# Patient Record
Sex: Male | Born: 1975 | Race: Black or African American | Hispanic: No | Marital: Single | Smoking: Never smoker
Health system: Southern US, Community
[De-identification: ages and names within clinical notes are randomized; demographics above are authoritative.]

## PROBLEM LIST (undated history)

## (undated) DIAGNOSIS — T7840XA Allergy, unspecified, initial encounter: Secondary | ICD-10-CM

## (undated) HISTORY — DX: Allergy, unspecified, initial encounter: T78.40XA

---

## 2008-11-23 ENCOUNTER — Encounter: Admission: RE | Admit: 2008-11-23 | Discharge: 2008-11-23 | Payer: Self-pay | Admitting: Emergency Medicine

## 2011-07-08 ENCOUNTER — Ambulatory Visit (INDEPENDENT_AMBULATORY_CARE_PROVIDER_SITE_OTHER): Payer: Managed Care, Other (non HMO) | Admitting: Family Medicine

## 2011-07-08 VITALS — BP 115/65 | HR 79 | Temp 97.8°F | Resp 18 | Ht 66.25 in | Wt 140.8 lb

## 2011-07-08 DIAGNOSIS — J309 Allergic rhinitis, unspecified: Secondary | ICD-10-CM

## 2011-07-08 DIAGNOSIS — J019 Acute sinusitis, unspecified: Secondary | ICD-10-CM

## 2011-07-08 MED ORDER — FLUTICASONE PROPIONATE 50 MCG/ACT NA SUSP: 2.0000 | Freq: Every day | NASAL | Status: DC

## 2011-07-08 MED ORDER — AMOXICILLIN 875 MG PO TABS: 875.0000 mg | ORAL_TABLET | Freq: Two times a day (BID) | ORAL | Status: AC

## 2011-07-08 NOTE — Progress Notes (Signed)
  Subjective:    Patient ID: Alvin Walker, male    DOB: 1975/11/30, 36 y.o.   MRN: [id number]  HPI 36 yo male here with URI symptoms for 2 weeks. Nasal congestion, can't breathe through nose.  Blows nose and gets lot of yellow mucus but doesn't drain spontaneously.  Can only breathe if uses Afrin - tried not to use too often.  Does have cough, PND, no sore throat.  No fever.    Review of Systems Negative except as per HPI     Objective:   Physical Exam  Constitutional: He appears well-developed. No distress.  HENT:  Right Ear: Tympanic membrane, external ear and ear canal normal. Tympanic membrane is not injected, not scarred, not perforated, not erythematous, not retracted and not bulging.  Left Ear: Tympanic membrane, external ear and ear canal normal. Tympanic membrane is not injected, not scarred, not perforated, not erythematous, not retracted and not bulging.  Nose: Mucosal edema and rhinorrhea present. Right sinus exhibits maxillary sinus tenderness. Right sinus exhibits no frontal sinus tenderness. Left sinus exhibits no maxillary sinus tenderness and no frontal sinus tenderness.  Mouth/Throat: Uvula is midline, oropharynx is clear and moist and mucous membranes are normal. No oropharyngeal exudate or tonsillar abscesses.  Cardiovascular: Normal rate, regular rhythm, normal heart sounds and intact distal pulses.   No murmur heard. Pulmonary/Chest: Effort normal and breath sounds normal. No respiratory distress. He has no wheezes. He has no rales.  Lymphadenopathy:       Head (right side): No submandibular and no preauricular adenopathy present.       Head (left side): No submandibular and no preauricular adenopathy present.       Right cervical: No superficial cervical and no posterior cervical adenopathy present.      Left cervical: No superficial cervical and no posterior cervical adenopathy present.       Right: No supraclavicular adenopathy present.       Left: No  supraclavicular adenopathy present.  Skin: Skin is warm and dry.          Assessment & Plan:  Sinusitis resulting from uncontrolled allergic rhinitis - amoxicillin, zyrtec, flonase.

## 2011-07-14 IMAGING — CT CT HEAD W/O CM
2 series · 16 of 30 positions shown, 20 images · non-contrast
Comparison: None

CLINICAL DATA: Right-sided headache for 2-3 years, no trauma

CT HEAD WITHOUT CONTRAST
TECHNIQUE: Contiguous axial images were obtained from the base of
the skull through the vertex without contrast.

[Series 2: head wo · axial · 0.49mm/px · z∈[-405,-295]mm · 13 of 26 slices shown, 17 images]
[im 2/26  brain]
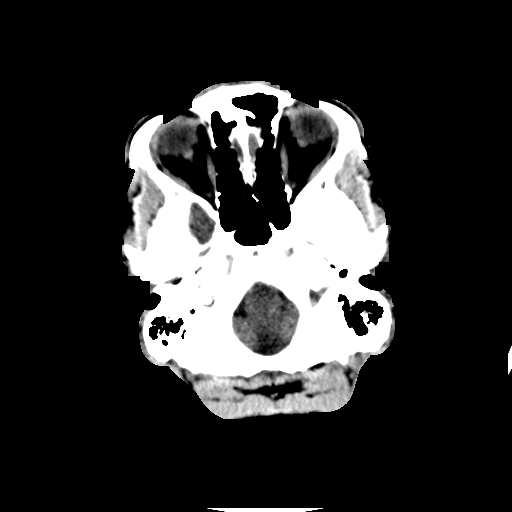
[im 2/26  bone]
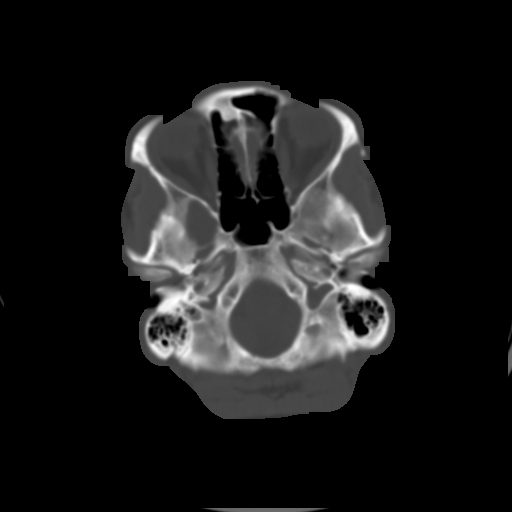
[im 4/26  brain]
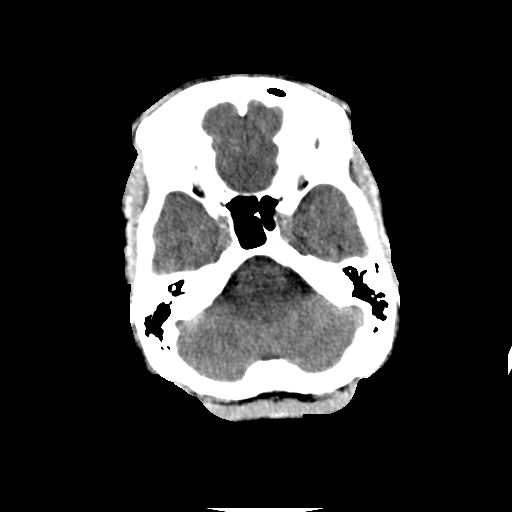
[im 6/26  brain]
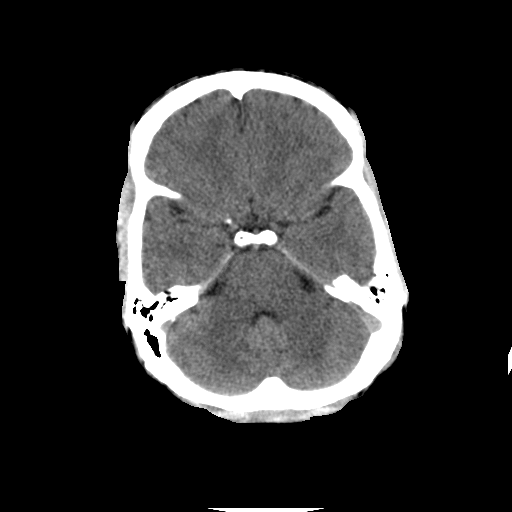
[im 8/26  brain]
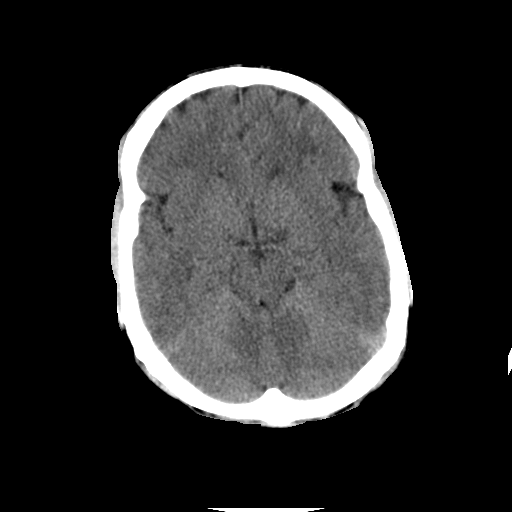
[im 9/26  brain]
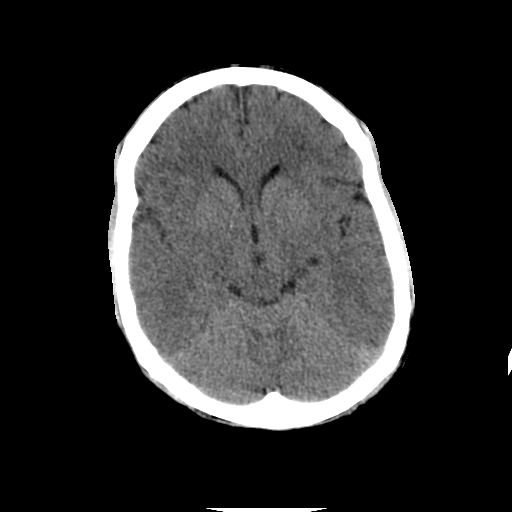
[im 9/26  bone]
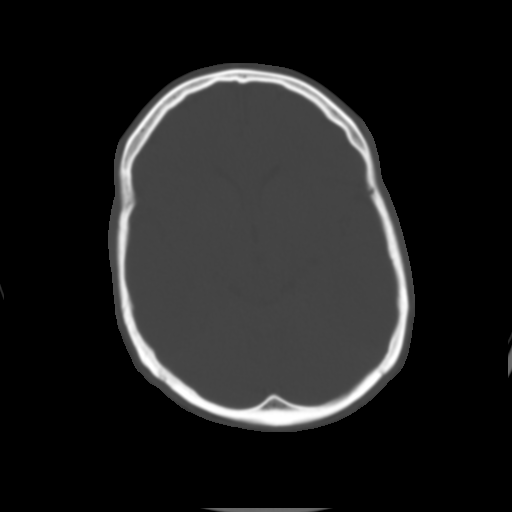
[im 11/26  brain]
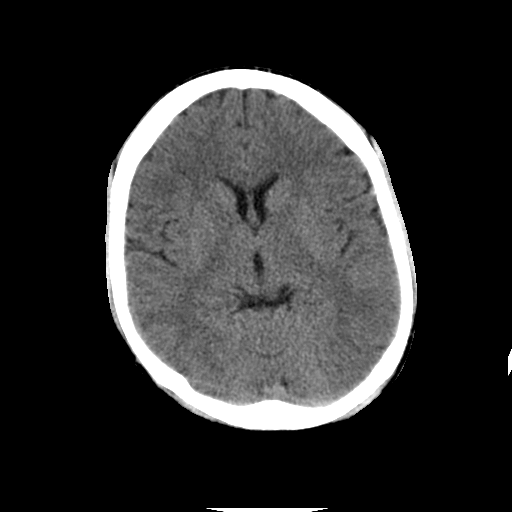
[im 13/26  brain]
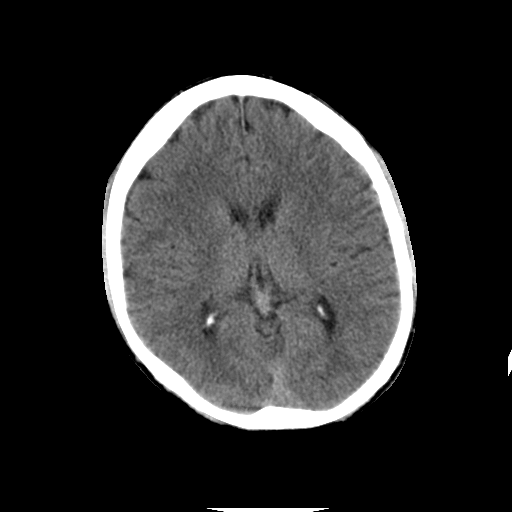
[im 15/26  brain]
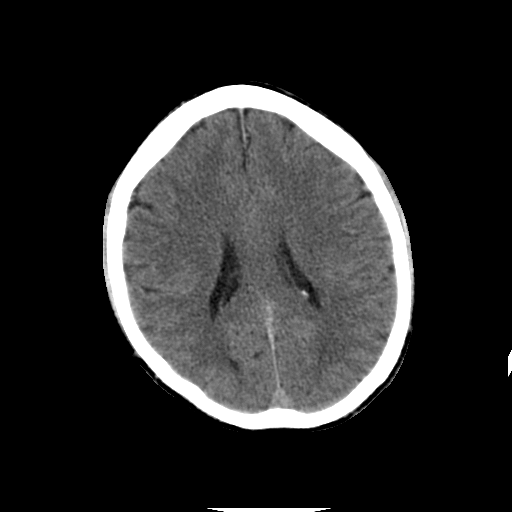
[im 17/26  brain]
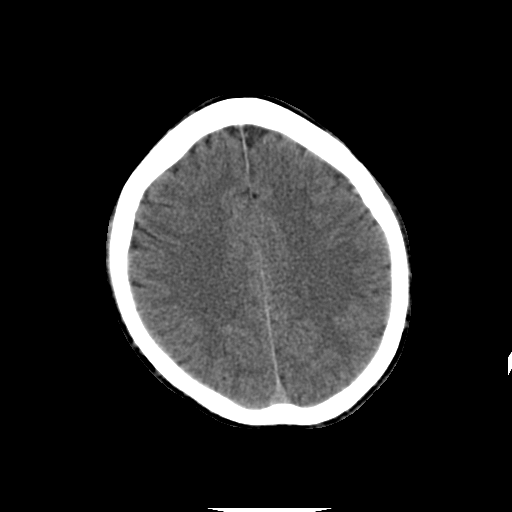
[im 17/26  bone]
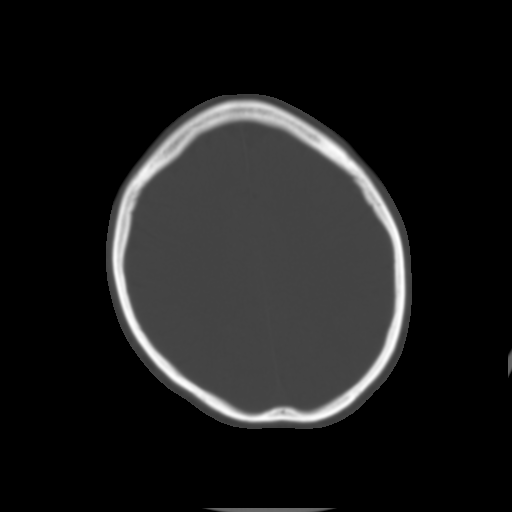
[im 18/26  brain]
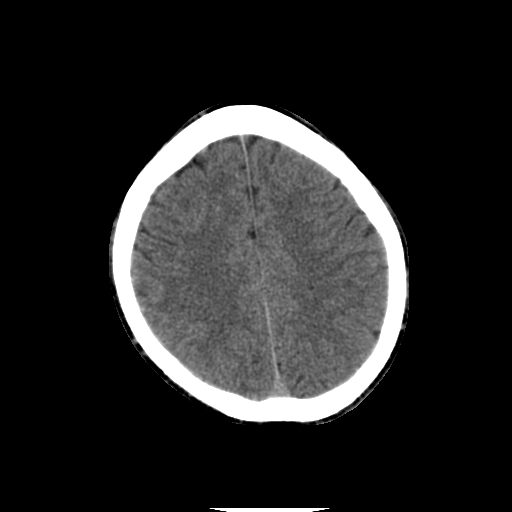
[im 20/26  brain]
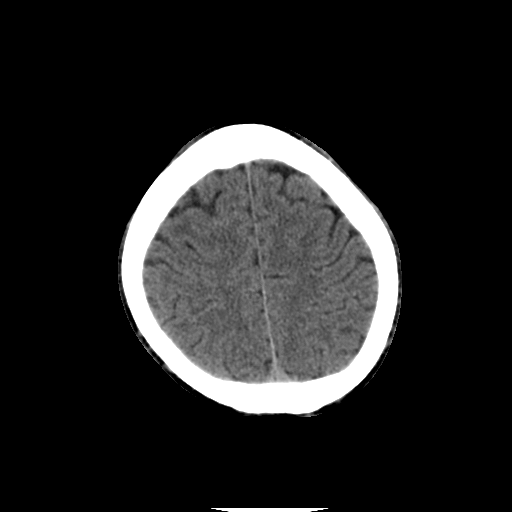
[im 22/26  brain]
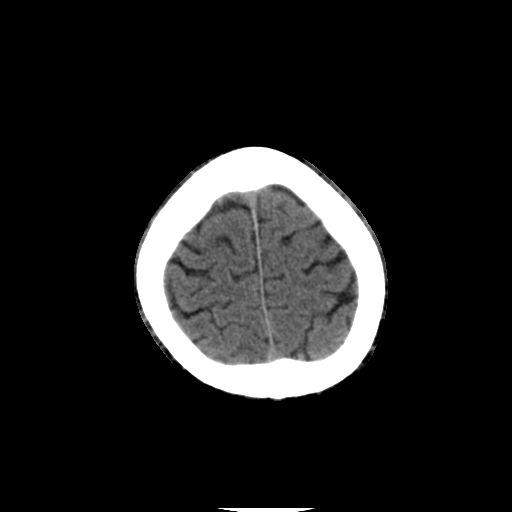
[im 24/26  brain]
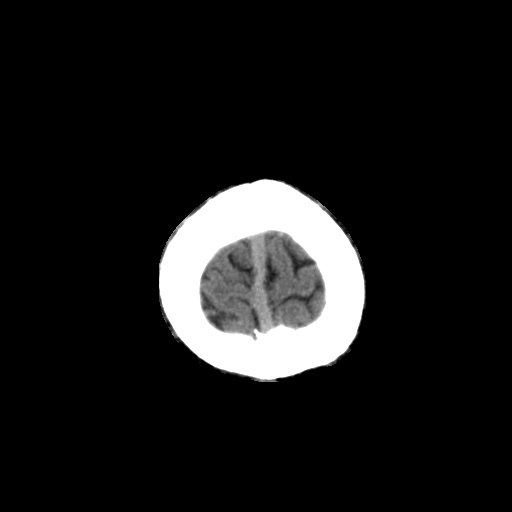
[im 24/26  bone]
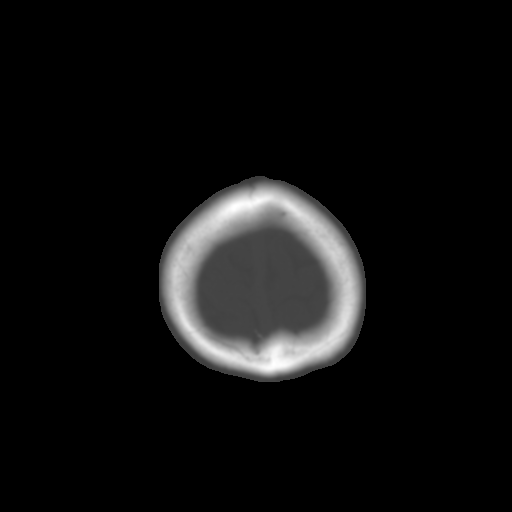

[Series 3: head bone · axial · 0.49mm/px · z∈[-405,-370]mm · 3 of 26 slices shown]
[im 2/26  bone]
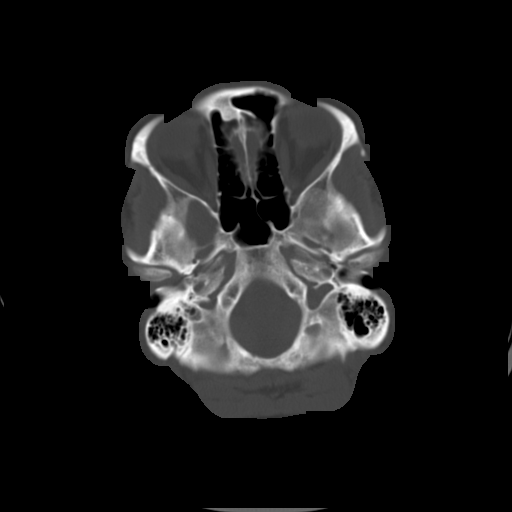
[im 6/26  bone]
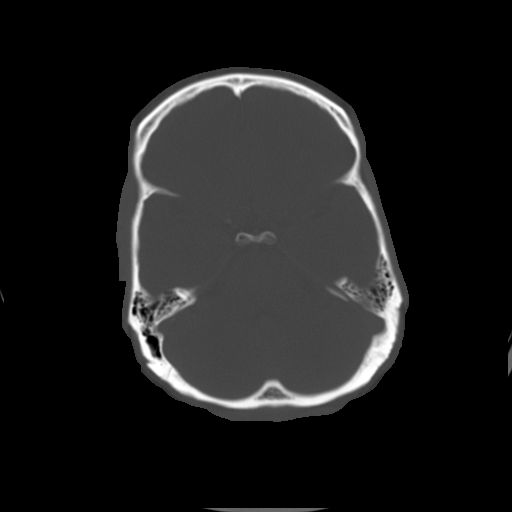
[im 9/26  bone]
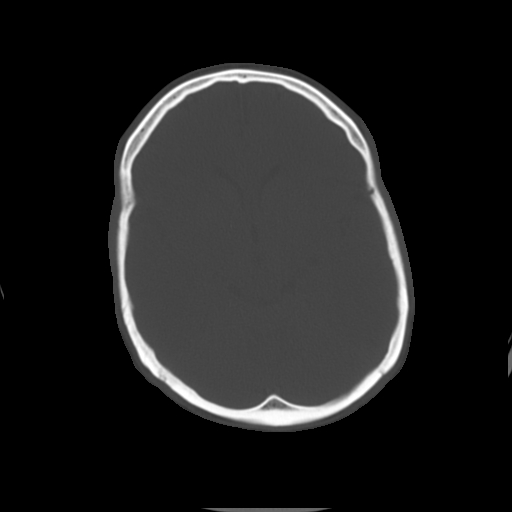

[16 of 30 positions shown; findings below may reference images not displayed]

FINDINGS: The ventricular system is normal in size and
configuration, and the septum is in a normal midline position.  The
fourth ventricle and basilar cisterns appear normal.  No
hemorrhage, mass lesion, or acute infarction is seen.  No bony
abnormality is noted.
IMPRESSION: No acute intracranial abnormality.

## 2012-03-08 ENCOUNTER — Emergency Department (HOSPITAL_COMMUNITY)
Admission: EM | Admit: 2012-03-08 | Discharge: 2012-03-08 | Disposition: A | Discharge status: Home / Self Care | Payer: Managed Care, Other (non HMO) | Attending: Emergency Medicine | Admitting: Emergency Medicine

## 2012-03-08 ENCOUNTER — Emergency Department (HOSPITAL_COMMUNITY): Payer: Managed Care, Other (non HMO)

## 2012-03-08 ENCOUNTER — Encounter (HOSPITAL_COMMUNITY): Payer: Self-pay | Admitting: Emergency Medicine

## 2012-03-08 DIAGNOSIS — R110 Nausea: Secondary | ICD-10-CM | POA: Insufficient documentation

## 2012-03-08 DIAGNOSIS — B9789 Other viral agents as the cause of diseases classified elsewhere: Secondary | ICD-10-CM | POA: Insufficient documentation

## 2012-03-08 DIAGNOSIS — J3489 Other specified disorders of nose and nasal sinuses: Secondary | ICD-10-CM | POA: Insufficient documentation

## 2012-03-08 DIAGNOSIS — R630 Anorexia: Secondary | ICD-10-CM | POA: Insufficient documentation

## 2012-03-08 DIAGNOSIS — R52 Pain, unspecified: Secondary | ICD-10-CM | POA: Insufficient documentation

## 2012-03-08 DIAGNOSIS — B349 Viral infection, unspecified: Secondary | ICD-10-CM

## 2012-03-08 DIAGNOSIS — R6889 Other general symptoms and signs: Secondary | ICD-10-CM | POA: Insufficient documentation

## 2012-03-08 DIAGNOSIS — R51 Headache: Secondary | ICD-10-CM | POA: Insufficient documentation

## 2012-03-08 DIAGNOSIS — R509 Fever, unspecified: Secondary | ICD-10-CM | POA: Insufficient documentation

## 2012-03-08 DIAGNOSIS — J029 Acute pharyngitis, unspecified: Secondary | ICD-10-CM | POA: Insufficient documentation

## 2012-03-08 MED ORDER — HYDROCODONE-ACETAMINOPHEN 7.5-325 MG/15ML PO SOLN: 15.0000 mL | Freq: Four times a day (QID) | ORAL | Status: DC | PRN

## 2012-03-08 MED ORDER — HYDROCOD POLST-CHLORPHEN POLST 10-8 MG/5ML PO LQCR
5.0000 mL | Freq: Once | ORAL | Status: AC
  Administered 2012-03-08: 5 mL via ORAL
  Filled 2012-03-08: qty 5

## 2012-03-08 MED ORDER — GUAIFENESIN 100 MG/5ML PO LIQD: 100.0000 mg | ORAL | Status: DC | PRN

## 2012-03-08 MED ORDER — ACETAMINOPHEN 325 MG PO TABS
650.0000 mg | ORAL_TABLET | Freq: Once | ORAL | Status: AC
  Administered 2012-03-08: 650 mg via ORAL
  Filled 2012-03-08: qty 2

## 2012-03-08 NOTE — ED Provider Notes (Signed)
History     CSN: [id number]  Arrival date & time 03/08/12  1147   First MD Initiated Contact with Patient 03/08/12 1213      Chief Complaint  Patient presents with  . Cough  . Fever  . Generalized Body Aches    3 day hx of URI sx. Sx unresponsive to OTC meds    (Consider location/radiation/quality/duration/timing/severity/associated sxs/prior treatment) HPI  37 year old male presents with flulike symptoms. Patient reports for the past 3 days he has developed acute onset of headache, sneezing, nasal congestion, cough productive with white sputum, nausea, body aches, decrease in appetite. Also endorsed fever, and chills. He reports taking over-the-counter Robitussin, phenol, and Alka-Seltzer with some relief. Today while coughing he notice trace of blood in his sputum which concerns him.  Otherwise patient denies double vision, neck stiffness, vomiting, diarrhea, abdominal pain, or rash. He is a nonsmoker. He is up-to-date with his shots but did not receive a flu shot as it caused him to be sick last year.   History reviewed. No pertinent past medical history.  History reviewed. No pertinent past surgical history.  Family History  Problem Relation Age of Onset  . Hypertension Father     History  Substance Use Topics  . Smoking status: Never Smoker   . Smokeless tobacco: Not on file  . Alcohol Use: Yes      Review of Systems  Constitutional:       A complete 10 system review of systems was obtained and all systems are negative except as noted in the HPI and PMH.    Allergies  Review of patient's allergies indicates no known allergies.  Home Medications   Current Outpatient Rx  Name  Route  Sig  Dispense  Refill  . GUAIFENESIN 100 MG/5ML PO LIQD   Oral   Take 200 mg by mouth 3 (three) times daily as needed.         Marland Kitchen PHENOL 1.4 % MT LIQD   Mouth/Throat   Use as directed 1 spray in the mouth or throat daily as needed. For sore throat         .  PHENYLEPH-CPM-DM-APAP 06-12-08-250 MG PO TBEF   Oral   Take 2 tablets by mouth every 6 (six) hours as needed. For cold           BP 122/75  Pulse 91  Temp 101.3 F (38.5 C) (Oral)  Resp 22  SpO2 98%  Physical Exam  Nursing note and vitals reviewed. Constitutional: He is oriented to person, place, and time. He appears well-developed and well-nourished. No distress.       Awake, alert, nontoxic appearance  HENT:  Head: Atraumatic.       Ear: Mild erythema to the canal and TM bilaterally without significant evidence of infection, nontender on palpation  Nose: Normal  Mouth: Mild post oral pharyngeal erythema without tonsillar enlargement, exudates, or evidence of the tissue infection. Uvula is midline.  Eyes: Conjunctivae normal are normal. Right eye exhibits no discharge. Left eye exhibits no discharge.  Neck: Normal range of motion. Neck supple.       No nuchal rigidity.  Cardiovascular: Normal rate and regular rhythm.   Pulmonary/Chest: Effort normal. No respiratory distress. He exhibits no tenderness.  Abdominal: Soft. There is no tenderness. There is no rebound.  Musculoskeletal: He exhibits no edema and no tenderness.       ROM appears intact, no obvious focal weakness  Neurological: He is alert and oriented to person,  place, and time.  Skin: Skin is warm and dry. No rash noted.  Psychiatric: He has a normal mood and affect.    ED Course  Procedures (including critical care time)   Labs Reviewed  RAPID STREP SCREEN   Results for orders placed during the hospital encounter of 03/08/12  RAPID STREP SCREEN      Component Value Range   Streptococcus, Group A Screen (Direct) NEGATIVE  NEGATIVE   Dg Chest 2 View  03/08/2012  *RADIOLOGY REPORT*  Clinical Data: Cough, fever and congestion.  CHEST - 2 VIEW  Comparison: None.  Findings: The lung volumes are normal and no focal infiltrates are identified.  There is mild suggestion of bronchial and interstitial prominence  which may reflect inflammatory changes such as bronchitis or interstitial pneumonia.  No pulmonary edema, pleural fluid or pulmonary nodules are identified.  Cardiac and mediastinal contours are within normal limits.  Visualized bony structures are unremarkable.  IMPRESSION: No focal pulmonary infiltrate.  Suggestion of mild bronchial and interstitial prominence which may be chronic versus reflective of an inflammatory process.   Original Report Authenticated By: Irish Lack, M.D.    BP 122/75  Pulse 91  Temp 101.3 F (38.5 C) (Oral)  Resp 22  SpO2 98%  I have reviewed nursing notes and vital signs.  I reviewed available ER/hospitalization records thought the EMR   1. Viral syndrome/ Flu like symptom  MDM  Pt with flu-like sxs.  Has temp of 101.3.  Tylenol given.  Rapid strep test and CXR ordered.  Tussionex given in ER.   Pt with presentation with consistent with acute viral illness.  As patient does not present w/ any signs/symptoms of pneumonia or other complications, deferred  further labwork at this time.  CXR reviewed by me showed no focal pulmonary infiltrates.  No evidence of bacterial infections including meningitis, pharyngitis, otitis media, pneumonia, urinary tract infection. Patient provided with tussionex and tylenol in ED. Patient to continue ibuprofen and tylenol as needed for pain and fever. Continue fluid hydration.  Educated patient on diagnosis and natural course of illness.  Supportive care and preventive measures were discussed.   Follow up with primary physician in 2-5 days if symptoms continue. Return to ED if high fever, altered mental status, uncontrolled vomiting.  Impression:   Acute viral illness  Plan:  Discharge from ED Advised Pt on supportive therapies, including OTC acetaminophen or ibuprofen for fever and body aches, bed rest while significantly symptomatic, advancing clear fluids as tolerated (8-10cups), and thorough handwashing. Advised Pt to return  to school/work only after resolution of fever, abstain from exercise and contact sports until symptoms have improved, refrain from sharing cups/utensils/toothbrushes/straws/lip gloss/etc while potentially infectious.. Advised Pt to monitor for altered mental status, worsening fever, or respiratory distress. Instructed Pt to f/up w/ PCP or ER should symptoms worsen or not improve. Pt verbally expressed understanding and all questions were addressed to Pt's satisfaction.       Fayrene Helper, PA-C 03/08/12 1350

## 2012-03-08 NOTE — ED Notes (Signed)
Pt reports 3 day hx of sore throat , fever , chills, cough and generalized body aches. Discomfort unresponsive to OTC meds

## 2012-03-10 NOTE — ED Provider Notes (Signed)
Medical screening examination/treatment/procedure(s) were performed by non-physician practitioner and as supervising physician I was immediately available for consultation/collaboration.   Gwyneth Sprout, MD 03/10/12 [phone]

## 2014-08-31 ENCOUNTER — Ambulatory Visit (INDEPENDENT_AMBULATORY_CARE_PROVIDER_SITE_OTHER): Payer: Managed Care, Other (non HMO) | Admitting: Family Medicine

## 2014-08-31 VITALS — BP 102/70 | HR 76 | Temp 98.3°F | Resp 20 | Ht 67.5 in | Wt 149.0 lb

## 2014-08-31 DIAGNOSIS — J209 Acute bronchitis, unspecified: Secondary | ICD-10-CM

## 2014-08-31 MED ORDER — AZITHROMYCIN 250 MG PO TABS: ORAL_TABLET | ORAL | Status: DC

## 2014-08-31 MED ORDER — PREDNISONE 20 MG PO TABS: ORAL_TABLET | ORAL | Status: DC

## 2014-08-31 NOTE — Progress Notes (Signed)
 @  This chart was scribed for Elvina Sidle, MD by Andrew Au, ED Scribe. This patient was seen in room 3 and the patient's care was started at 11:42 AM.   Patient ID: Alvin Walker MRN: [id number], DOB: [birth year], 39 y.o. Date of Encounter: 08/31/2014, 11:41 AM  Primary Physician: No primary care provider on file.  Chief Complaint:  Chief Complaint  Patient presents with   Sore Throat    x 1 week   Cough    x 1 week dry cough   Chest Pain    HPI: 39 y.o. year old male presents with 1 week history of sore throat and dry cough. Pt has a persistent cough that worsen in air conditioned room. States cough causes chest pain. Pt has been wheezing during the night making it difficult for him to sleep.  He denies leg pain. He has hx of asthma as a child.   Pt works at [employer].  History reviewed. No pertinent past medical history.   Home Meds: Prior to Admission medications   Medication Sig Start Date End Date Taking? Authorizing Provider  guaiFENesin (ROBITUSSIN) 100 MG/5ML liquid Take 5-10 mLs (100-200 mg total) by mouth every 4 (four) hours as needed for cough. 03/08/12  Yes Fayrene Helper, PA-C  HYDROcodone-acetaminophen (HYCET) 7.5-325 mg/15 ml solution Take 15 mLs by mouth 4 (four) times daily as needed for pain or cough. Patient not taking: Reported on 08/31/2014 03/08/12   Fayrene Helper, PA-C  phenol [facility] MOUTH PAIN) 1.4 % LIQD Use as directed 1 spray in the mouth or throat daily as needed. For sore throat    Historical Provider, MD    Allergies: No Known Allergies  History   Social History   Marital Status: Single    Spouse Name: N/A   Number of Children: N/A   Years of Education: N/A   Occupational History   Not on file.   Social History Main Topics   Smoking status: Never Smoker    Smokeless tobacco: Not on file   Alcohol Use: Yes   Drug Use: Not on file   Sexual Activity: Not on file   Other Topics Concern   Not on file    Social History Narrative     Review of Systems: Constitutional: negative for chills, fever, night sweats or weight changes HEENT: see above Cardiovascular: negative for chest pain or palpitations Respiratory: negative for hemoptysis, wheezing, or shortness of breath Abdominal: negative for abdominal pain, nausea, vomiting or diarrhea Dermatological: negative for rash Neurologic: negative for he A complete 10 system review of systems was obtained and all systems are negative except as noted in the HPI and PMH.    Physical Exam: Blood pressure 102/70, pulse 76, temperature 98.3 F (36.8 C), temperature source Oral, resp. rate 20, height 5' 7.5" (1.715 m), weight 149 lb (67.586 kg), SpO2 98 %., Body mass index is 22.98 kg/(m^2). General: Well developed, well nourished, in no acute distress. Head: Normocephalic, atraumatic, eyes without discharge, sclera non-icteric, nares are patent. Bilateral auditory canals clear, TM's are without perforation, pearly grey with reflective cone of light bilaterally. No sinus TTP. Oral cavity moist, dentition normal. Posterior pharynx with post nasal drip and mild erythema. No peritonsillar abscess or tonsillar exudate. Neck: Supple. No thyromegaly. Full ROM. No lymphadenopathy. Lungs: Clear bilaterally to auscultation with a few expiratory wheezes, no rales, or rhonchi. Breathing is unlabored. Heart: RRR with S1 S2. No murmurs, rubs, or gallops appreciated. Abdomen: Soft, non-tender, non-distended with normoactive bowel sounds.  No hepatomegaly. No rebound/guarding. No obvious abdominal masses. Msk:  Strength and tone normal for age. Extremities: No clubbing or cyanosis. No edema. Neuro: Alert and oriented X 3. Moves all extremities spontaneously. CNII-XII grossly in tact. Psych:  Responds to questions appropriately with a normal affect.     ASSESSMENT AND PLAN:  39 y.o. year old male with acute pharyngitis This chart was scribed in my presence and  reviewed by me personally.    ICD-9-CM ICD-10-CM   1. Acute bronchitis, unspecified organism 466.0 J20.9 azithromycin (ZITHROMAX) 250 MG tablet     predniSONE (DELTASONE) 20 MG tablet     Signed, Elvina SidleKurt Lauenstein, MD  - -Tylenol/Motrin prn -Rest/fluids -RTC precautions -RTC 3-5 days if no improvement  Signed, Elvina SidleKurt Lauenstein, MD 08/31/2014 11:41 AM

## 2014-08-31 NOTE — Patient Instructions (Signed)

## 2014-09-12 ENCOUNTER — Ambulatory Visit (INDEPENDENT_AMBULATORY_CARE_PROVIDER_SITE_OTHER): Payer: Managed Care, Other (non HMO) | Admitting: Family Medicine

## 2014-09-12 VITALS — BP 138/82 | HR 64 | Temp 98.3°F | Resp 16 | Ht 67.5 in | Wt 146.0 lb

## 2014-09-12 DIAGNOSIS — R197 Diarrhea, unspecified: Secondary | ICD-10-CM | POA: Diagnosis not present

## 2014-09-12 DIAGNOSIS — J309 Allergic rhinitis, unspecified: Secondary | ICD-10-CM | POA: Diagnosis not present

## 2014-09-12 DIAGNOSIS — R05 Cough: Secondary | ICD-10-CM | POA: Diagnosis not present

## 2014-09-12 DIAGNOSIS — K219 Gastro-esophageal reflux disease without esophagitis: Secondary | ICD-10-CM

## 2014-09-12 DIAGNOSIS — R059 Cough, unspecified: Secondary | ICD-10-CM

## 2014-09-12 MED ORDER — ALBUTEROL SULFATE HFA 108 (90 BASE) MCG/ACT IN AERS: 1.0000 | INHALATION_SPRAY | RESPIRATORY_TRACT | Status: DC | PRN

## 2014-09-12 NOTE — Patient Instructions (Signed)
For your cough, start with Zantac over-the-counter twice per day, Allegra once per day, and only if needed for wheezing or tightness can try albuterol inhaler. I suspect the cough may be a combination of allergies and reflux so as your symptoms improve can stop Zantac initially or Allegra depending on which one seems to help cough more.  If your symptoms have not improved in the next week to 2 weeks, return for chest x-ray and further evaluation of this cough. Return to the clinic or go to the nearest emergency room if any of your symptoms worsen or new symptoms occur.  Your diarrhea is most likely due to the Z-Pak. This should improve as that medication comes on your system. If you have any blood in your stool, abdominal pain that is not improved with the diarrhea, worsening of diarrhea, or fever - return to clinic for further evaluation and testing as occasionally a different bacterial infection can be associated with antibiotic use.    Food Choices for Gastroesophageal Reflux Disease When you have gastroesophageal reflux disease (GERD), the foods you eat and your eating habits are very important. Choosing the right foods can help ease the discomfort of GERD. WHAT GENERAL GUIDELINES DO I NEED TO FOLLOW?  Choose fruits, vegetables, whole grains, low-fat dairy products, and low-fat meat, fish, and poultry.  Limit fats such as oils, salad dressings, butter, nuts, and avocado.  Keep a food diary to identify foods that cause symptoms.  Avoid foods that cause reflux. These may be different for different people.  Eat frequent small meals instead of three large meals each day.  Eat your meals slowly, in a relaxed setting.  Limit fried foods.  Cook foods using methods other than frying.  Avoid drinking alcohol.  Avoid drinking large amounts of liquids with your meals.  Avoid bending over or lying down until 2-3 hours after eating. WHAT FOODS ARE NOT RECOMMENDED? The following are some foods  and drinks that may worsen your symptoms: Vegetables Tomatoes. Tomato juice. Tomato and spaghetti sauce. Chili peppers. Onion and garlic. Horseradish. Fruits Oranges, grapefruit, and lemon (fruit and juice). Meats High-fat meats, fish, and poultry. This includes hot dogs, ribs, ham, sausage, salami, and bacon. Dairy Whole milk and chocolate milk. Sour cream. Cream. Butter. Ice cream. Cream cheese.  Beverages Coffee and tea, with or without caffeine. Carbonated beverages or energy drinks. Condiments Hot sauce. Barbecue sauce.  Sweets/Desserts Chocolate and cocoa. Donuts. Peppermint and spearmint. Fats and Oils High-fat foods, including [place] fries and potato chips. Other Vinegar. Strong spices, such as black pepper, white pepper, red pepper, cayenne, curry powder, cloves, ginger, and chili powder. The items listed above may not be a complete list of foods and beverages to avoid. Contact your dietitian for more information. Document Released: 01/28/2005 Document Revised: 02/02/2013 Document Reviewed: 12/02/2012 [facility] Patient Information 2015 [place], [place]. This information is not intended to replace advice given to you by your health care provider. Make sure you discuss any questions you have with your health care provider. Diarrhea Diarrhea is frequent loose and watery bowel movements. It can cause you to feel weak and dehydrated. Dehydration can cause you to become tired and thirsty, have a dry mouth, and have decreased urination that often is dark yellow. Diarrhea is a sign of another problem, most often an infection that will not last long. In most cases, diarrhea typically lasts 2-3 days. However, it can last longer if it is a sign of something more serious. It is important to treat  your diarrhea as directed by your caregiver to lessen or prevent future episodes of diarrhea. CAUSES  Some common causes include:  Gastrointestinal infections caused by viruses, bacteria, or  parasites.  Food poisoning or food allergies.  Certain medicines, such as antibiotics, chemotherapy, and laxatives.  Artificial sweeteners and fructose.  Digestive disorders. HOME CARE INSTRUCTIONS  Ensure adequate fluid intake (hydration): Have 1 cup (8 oz) of fluid for each diarrhea episode. Avoid fluids that contain simple sugars or sports drinks, fruit juices, whole milk products, and sodas. Your urine should be clear or pale yellow if you are drinking enough fluids. Hydrate with an oral rehydration solution that you can purchase at pharmacies, retail stores, and online. You can prepare an oral rehydration solution at home by mixing the following ingredients together:   - tsp table salt.   tsp baking soda.   tsp salt substitute containing potassium chloride.  1  tablespoons sugar.  1 L (34 oz) of water.  Certain foods and beverages may increase the speed at which food moves through the gastrointestinal (GI) tract. These foods and beverages should be avoided and include:  Caffeinated and alcoholic beverages.  High-fiber foods, such as raw fruits and vegetables, nuts, seeds, and whole grain breads and cereals.  Foods and beverages sweetened with sugar alcohols, such as xylitol, sorbitol, and mannitol.  Some foods may be well tolerated and may help thicken stool including:  Starchy foods, such as rice, toast, pasta, low-sugar cereal, oatmeal, grits, baked potatoes, crackers, and bagels.  Bananas.  Applesauce.  Add probiotic-rich foods to help increase healthy bacteria in the GI tract, such as yogurt and fermented milk products.  Wash your hands well after each diarrhea episode.  Only take over-the-counter or prescription medicines as directed by your caregiver.  Take a warm bath to relieve any burning or pain from frequent diarrhea episodes. SEEK IMMEDIATE MEDICAL CARE IF:   You are unable to keep fluids down.  You have persistent vomiting.  You have blood in  your stool, or your stools are black and tarry.  You do not urinate in 6-8 hours, or there is only a small amount of very dark urine.  You have abdominal pain that increases or localizes.  You have weakness, dizziness, confusion, or light-headedness.  You have a severe headache.  Your diarrhea gets worse or does not get better.  You have a fever or persistent symptoms for more than 2-3 days.  You have a fever and your symptoms suddenly get worse. MAKE SURE YOU:   Understand these instructions.  Will watch your condition.  Will get help right away if you are not doing well or get worse. Document Released: 01/18/2002 Document Revised: 06/14/2013 Document Reviewed: 10/06/2011 [facility] Patient Information 2015 [place], [place]. This information is not intended to replace advice given to you by your health care provider. Make sure you discuss any questions you have with your health care provider.

## 2014-09-12 NOTE — Progress Notes (Signed)
Subjective:  This chart was scribed for Alvin Staggers, MD by [facility], medical scribe at [facility].The patient was seen in exam room 08 and the patient's care was started at 9:51 PM.   Patient ID: Alvin Walker, male    DOB: Dec 26, 1975, 39 y.o.   MRN: [id number] Chief Complaint  Patient presents with  . Follow-up    Bronchitis  . Abdominal Pain    x 1 week   HPI HPI Comments: Alvin Walker is a 39 y.o. male who presents to [facility] complaining of a follow up regarding bronchitis. He also complains of abdominal pain for one week.  Bronchitis: Seen 11 days ago by Dr. Milus Glazier with a one week history of a dry cough and sore throat. Treated with azithromycin and prednisone 40 mg daily for five days for bronchitis. When he walks into a cold area he begins to cough. Pt complains of a post nasal drip. Diagnosed with asthma at 39 years old and prescribed an inhaler at that time, he has not used an inhaler since he was a child. Takes seasonal allergies but not in the last couple weeks. Last taken in January. Some exercise, only once a week. He does cough but no wheezing or chest tightness. Cough did improve on prednisone. Occasional heartburn after eating something spicy that occurs about one week. No chest pain.  Diarrhea Ongoing for 10 days with associated abdominal pain and bloating at the time of diarrhea. This started shortly starting the azithromycin. Completed his antibiotics on Sunday but symptoms have persisted. After eating food he immediately has a bowel movement. Diarrhea only occurs after eating. Seems like the food is not completely digested.Two episodes yesterday. No fever, blood in the stool.   There are no active problems to display for this patient.  History reviewed. No pertinent past medical history. History reviewed. No pertinent past surgical history. No Known Allergies Prior to Admission medications   Medication Sig  Start Date End Date Taking? Authorizing Provider  azithromycin (ZITHROMAX) 250 MG tablet Take 2 tabs PO x 1 dose, then 1 tab PO QD x 4 days Patient not taking: Reported on 09/12/2014 08/31/14   Elvina Sidle, MD  guaiFENesin (ROBITUSSIN) 100 MG/5ML liquid Take 5-10 mLs (100-200 mg total) by mouth every 4 (four) hours as needed for cough. Patient not taking: Reported on 09/12/2014 03/08/12   Fayrene Helper, PA-C  HYDROcodone-acetaminophen (HYCET) 7.5-325 mg/15 ml solution Take 15 mLs by mouth 4 (four) times daily as needed for pain or cough. Patient not taking: Reported on 08/31/2014 03/08/12   Fayrene Helper, PA-C  phenol [facility] MOUTH PAIN) 1.4 % LIQD Use as directed 1 spray in the mouth or throat daily as needed. For sore throat    Historical Provider, MD  predniSONE (DELTASONE) 20 MG tablet Two daily with food Patient not taking: Reported on 09/12/2014 08/31/14   Elvina Sidle, MD   History   Social History  . Marital Status: Single    Spouse Name: N/A  . Number of Children: N/A  . Years of Education: N/A   Occupational History  . Not on file.   Social History Main Topics  . Smoking status: Never Smoker   . Smokeless tobacco: Not on file  . Alcohol Use: Yes  . Drug Use: Not on file  . Sexual Activity: Not on file   Other Topics Concern  . Not on file   Social History Narrative   Review of Systems  Constitutional: Negative for fever.  HENT: Positive for postnasal drip.   Respiratory: Positive for cough. Negative for chest tightness, shortness of breath and wheezing.   Cardiovascular: Negative for chest pain.  Gastrointestinal: Positive for abdominal pain and diarrhea. Negative for blood in stool.      Objective:  BP 138/82 mmHg  Pulse 64  Temp(Src) 98.3 F (36.8 C) (Oral)  Resp 16  Ht 5' 7.5" (1.715 m)  Wt 146 lb (66.225 kg)  BMI 22.52 kg/m2  SpO2 99% Physical Exam  Constitutional: He is oriented to person, place, and time. He appears well-developed and well-nourished.  No distress.  HENT:  Head: Normocephalic and atraumatic.  Right Ear: Tympanic membrane, external ear and ear canal normal.  Left Ear: Tympanic membrane, external ear and ear canal normal.  Nose: No rhinorrhea.  Mouth/Throat: Oropharynx is clear and moist and mucous membranes are normal. No oropharyngeal exudate or posterior oropharyngeal erythema.  Slight edema of the turbinates in the nose.  Eyes: Conjunctivae are normal. Pupils are equal, round, and reactive to light.  Neck: Normal range of motion. Neck supple.  Cardiovascular: Normal rate, regular rhythm, normal heart sounds and intact distal pulses.   No murmur heard. Pulmonary/Chest: Effort normal and breath sounds normal. No respiratory distress. He has no wheezes. He has no rhonchi. He has no rales.  Abdominal: Soft. There is no tenderness.  Musculoskeletal: Normal range of motion.  Lymphadenopathy:    He has no cervical adenopathy.  Neurological: He is alert and oriented to person, place, and time.  Skin: Skin is warm and dry. No rash noted.  Psychiatric: He has a normal mood and affect. His behavior is normal.  Nursing note and vitals reviewed.     Assessment & Plan:   Alvin Walker is a 39 y.o. male Cough - Plan: albuterol (PROVENTIL HFA;VENTOLIN HFA) 108 (90 BASE) MCG/ACT inhaler Allergic rhinitis, unspecified allergic rhinitis type Gastroesophageal reflux disease, esophagitis presence not specified  - Possible multifactorial cough, with reported PND, and heartburn symptoms on occasion. He may have cough from allergies, laryngopharyngeal reflux, or may be asthmatic in nature.  -Trial of Allegra and Zantac initially, avoidance of trigger foods for reflux. If cough improves, can wean to just one or the other medication based on best control.  -Provera prescription given if needed for wheezing or bronchospastic cough. If he uses this more than 1 or 2 times per week, or any nighttime symptoms, return to clinic to discuss  other treatment.  -Recheck in next 2 weeks of cough is not resolved.  Diarrhea  -Postprandial, suspected side effect from azithromycin. No red flag symptoms of fever or bloody diarrhea. Overall reassuring exam. Decided on symptomatic care only for now, as azithromycin should be on its way out of his system over the next week. If symptoms worsen or not improving in that time, return to clinic for C. difficile testing. Sooner if worse  Meds ordered this encounter  Medications  . albuterol (PROVENTIL HFA;VENTOLIN HFA) 108 (90 BASE) MCG/ACT inhaler    Sig: Inhale 1-2 puffs into the lungs every 4 (four) hours as needed for wheezing or shortness of breath.    Dispense:  1 Inhaler    Refill:  0   Patient Instructions  For your cough, start with Zantac over-the-counter twice per day, Allegra once per day, and only if needed for wheezing or tightness can try albuterol inhaler. I suspect the cough may be a combination of allergies and reflux so as your symptoms improve can  stop Zantac initially or Allegra depending on which one seems to help cough more.  If your symptoms have not improved in the next week to 2 weeks, return for chest x-ray and further evaluation of this cough. Return to the clinic or go to the nearest emergency room if any of your symptoms worsen or new symptoms occur.  Your diarrhea is most likely due to the Z-Pak. This should improve as that medication comes on your system. If you have any blood in your stool, abdominal pain that is not improved with the diarrhea, worsening of diarrhea, or fever - return to clinic for further evaluation and testing as occasionally a different bacterial infection can be associated with antibiotic use.    Food Choices for Gastroesophageal Reflux Disease When you have gastroesophageal reflux disease (GERD), the foods you eat and your eating habits are very important. Choosing the right foods can help ease the discomfort of GERD. WHAT GENERAL GUIDELINES DO  I NEED TO FOLLOW?  Choose fruits, vegetables, whole grains, low-fat dairy products, and low-fat meat, fish, and poultry.  Limit fats such as oils, salad dressings, butter, nuts, and avocado.  Keep a food diary to identify foods that cause symptoms.  Avoid foods that cause reflux. These may be different for different people.  Eat frequent small meals instead of three large meals each day.  Eat your meals slowly, in a relaxed setting.  Limit fried foods.  Cook foods using methods other than frying.  Avoid drinking alcohol.  Avoid drinking large amounts of liquids with your meals.  Avoid bending over or lying down until 2-3 hours after eating. WHAT FOODS ARE NOT RECOMMENDED? The following are some foods and drinks that may worsen your symptoms: Vegetables Tomatoes. Tomato juice. Tomato and spaghetti sauce. Chili peppers. Onion and garlic. Horseradish. Fruits Oranges, grapefruit, and lemon (fruit and juice). Meats High-fat meats, fish, and poultry. This includes hot dogs, ribs, ham, sausage, salami, and bacon. Dairy Whole milk and chocolate milk. Sour cream. Cream. Butter. Ice cream. Cream cheese.  Beverages Coffee and tea, with or without caffeine. Carbonated beverages or energy drinks. Condiments Hot sauce. Barbecue sauce.  Sweets/Desserts Chocolate and cocoa. Donuts. Peppermint and spearmint. Fats and Oils High-fat foods, including [place] fries and potato chips. Other Vinegar. Strong spices, such as black pepper, white pepper, red pepper, cayenne, curry powder, cloves, ginger, and chili powder. The items listed above may not be a complete list of foods and beverages to avoid. Contact your dietitian for more information. Document Released: 01/28/2005 Document Revised: 02/02/2013 Document Reviewed: 12/02/2012 [facility] Patient Information 2015 [place], [place]. This information is not intended to replace advice given to you by your health care provider. Make sure you discuss  any questions you have with your health care provider. Diarrhea Diarrhea is frequent loose and watery bowel movements. It can cause you to feel weak and dehydrated. Dehydration can cause you to become tired and thirsty, have a dry mouth, and have decreased urination that often is dark yellow. Diarrhea is a sign of another problem, most often an infection that will not last long. In most cases, diarrhea typically lasts 2-3 days. However, it can last longer if it is a sign of something more serious. It is important to treat your diarrhea as directed by your caregiver to lessen or prevent future episodes of diarrhea. CAUSES  Some common causes include:  Gastrointestinal infections caused by viruses, bacteria, or parasites.  Food poisoning or food allergies.  Certain medicines, such as antibiotics, chemotherapy,  and laxatives.  Artificial sweeteners and fructose.  Digestive disorders. HOME CARE INSTRUCTIONS  Ensure adequate fluid intake (hydration): Have 1 cup (8 oz) of fluid for each diarrhea episode. Avoid fluids that contain simple sugars or sports drinks, fruit juices, whole milk products, and sodas. Your urine should be clear or pale yellow if you are drinking enough fluids. Hydrate with an oral rehydration solution that you can purchase at pharmacies, retail stores, and online. You can prepare an oral rehydration solution at home by mixing the following ingredients together:   - tsp table salt.   tsp baking soda.   tsp salt substitute containing potassium chloride.  1  tablespoons sugar.  1 L (34 oz) of water.  Certain foods and beverages may increase the speed at which food moves through the gastrointestinal (GI) tract. These foods and beverages should be avoided and include:  Caffeinated and alcoholic beverages.  High-fiber foods, such as raw fruits and vegetables, nuts, seeds, and whole grain breads and cereals.  Foods and beverages sweetened with sugar alcohols, such as  xylitol, sorbitol, and mannitol.  Some foods may be well tolerated and may help thicken stool including:  Starchy foods, such as rice, toast, pasta, low-sugar cereal, oatmeal, grits, baked potatoes, crackers, and bagels.  Bananas.  Applesauce.  Add probiotic-rich foods to help increase healthy bacteria in the GI tract, such as yogurt and fermented milk products.  Wash your hands well after each diarrhea episode.  Only take over-the-counter or prescription medicines as directed by your caregiver.  Take a warm bath to relieve any burning or pain from frequent diarrhea episodes. SEEK IMMEDIATE MEDICAL CARE IF:   You are unable to keep fluids down.  You have persistent vomiting.  You have blood in your stool, or your stools are black and tarry.  You do not urinate in 6-8 hours, or there is only a small amount of very dark urine.  You have abdominal pain that increases or localizes.  You have weakness, dizziness, confusion, or light-headedness.  You have a severe headache.  Your diarrhea gets worse or does not get better.  You have a fever or persistent symptoms for more than 2-3 days.  You have a fever and your symptoms suddenly get worse. MAKE SURE YOU:   Understand these instructions.  Will watch your condition.  Will get help right away if you are not doing well or get worse. Document Released: 01/18/2002 Document Revised: 06/14/2013 Document Reviewed: 10/06/2011 [facility] Patient Information 2015 [place], [place]. This information is not intended to replace advice given to you by your health care provider. Make sure you discuss any questions you have with your health care provider.

## 2014-10-03 ENCOUNTER — Ambulatory Visit (INDEPENDENT_AMBULATORY_CARE_PROVIDER_SITE_OTHER): Payer: Managed Care, Other (non HMO) | Admitting: Family Medicine

## 2014-10-03 VITALS — BP 98/60 | HR 67 | Temp 98.2°F | Resp 18 | Ht 66.5 in | Wt 146.0 lb

## 2014-10-03 DIAGNOSIS — R05 Cough: Secondary | ICD-10-CM | POA: Diagnosis not present

## 2014-10-03 DIAGNOSIS — R197 Diarrhea, unspecified: Secondary | ICD-10-CM

## 2014-10-03 DIAGNOSIS — R059 Cough, unspecified: Secondary | ICD-10-CM

## 2014-10-03 NOTE — Progress Notes (Signed)
Subjective:  This chart was scribed for Alvin Staggers, MD by Andrew Au, ED Scribe. This patient was seen in room 14 and the patient's care was started at 9:26 PM.   Patient ID: Alvin Walker, male    DOB: September 22, 1975, 39 y.o.   MRN: [id number]  HPI  HPI Comments: Alvin Walker is a 39 y.o. male who presents to the [facility] for a follow up for cough and abdominal symptoms.   Cough He had been seen a few weeks ago by Dr. Milus Glazier and treated with zpak and prednisone for bronchitis. Suspected GERD plus allergies plus bronchiospasm recc avoidence of trigger food for reflux. Allegra, zantac and albuterol prescription. recc CXR if not improved in 2 weeks.   Pt states he is still coughing, but reports improvement since last visit. He has been taking allegra and zantac but did not start inhaler.  Diarrhea Based on time, thought this was due to his zpak. Pt is still having explosive diarrhea. Pt reports initially diarrhea was just with eating but now goes about 4-5 times a day. He states he noticed diarrhea after starting azithromycin. He reports abdominal pressure/cramping prior to an episodes that is relieved after having a BM. He has not tried treating pain at home. He reports stomach issues in the past when eating spicy foods. He had diarrhea in the past but not as persistent. He denies taking laxative. He denies fever, chills, blood in stool, trouble urinating, nausea and emesis. Pt has not been seen by gastroenterologist.  There are no active problems to display for this patient.  No past medical history on file. No past surgical history on file. No Known Allergies Prior to Admission medications   Medication Sig Start Date End Date Taking? Authorizing Provider  albuterol (PROVENTIL HFA;VENTOLIN HFA) 108 (90 BASE) MCG/ACT inhaler Inhale 1-2 puffs into the lungs every 4 (four) hours as needed for wheezing or shortness of breath. Patient not taking: Reported on  10/03/2014 09/12/14   Shade Flood, MD   Social History   Social History  . Marital Status: Single    Spouse Name: N/A  . Number of Children: N/A  . Years of Education: N/A   Occupational History  . Not on file.   Social History Main Topics  . Smoking status: Never Smoker   . Smokeless tobacco: Not on file  . Alcohol Use: Yes  . Drug Use: Not on file  . Sexual Activity: Not on file   Other Topics Concern  . Not on file   Social History Narrative   Review of Systems  Constitutional: Negative for fever and chills.  Respiratory: Positive for cough.   Gastrointestinal: Positive for abdominal pain ( relieved after BM) and diarrhea. Negative for nausea, vomiting and blood in stool.  Genitourinary: Negative for dysuria, urgency, frequency, hematuria and difficulty urinating.    Objective:   Physical Exam  Constitutional: He is oriented to person, place, and time. He appears well-developed and well-nourished. No distress.  HENT:  Head: Normocephalic and atraumatic.  Eyes: Conjunctivae and EOM are normal.  Neck: Neck supple.  Cardiovascular: Normal rate, regular rhythm and normal heart sounds.  Exam reveals no gallop and no friction rub.   No murmur heard. Pulmonary/Chest: Effort normal and breath sounds normal. No respiratory distress. He has no wheezes. He has no rales. He exhibits no tenderness.  Abdominal: Soft. Bowel sounds are normal. There is tenderness ( minimal) in the suprapubic area. There is no rebound, no  guarding and no CVA tenderness.  Musculoskeletal: Normal range of motion.  Neurological: He is alert and oriented to person, place, and time.  Skin: Skin is warm and dry.  Psychiatric: He has a normal mood and affect. His behavior is normal.  Nursing note and vitals reviewed.  Filed Vitals:   10/03/14 2018  BP: 98/60  Pulse: 67  Temp: 98.2 F (36.8 C)  TempSrc: Oral  Resp: 18  Height: 5' 6.5" (1.689 m)  Weight: 146 lb (66.225 kg)  SpO2: 98%       Assessment & Plan:  [name] is a 39 y.o. male Cough  -Improving. May be component of allergies and reflux. Continue antihistamine and H2 blocker for GERD symptoms. If cough persists, return to clinic for further evaluation and chest x-ray as discussed.  Diarrhea - Plan: Clostridium Difficile by PCR, Stool culture, Ambulatory referral to Gastroenterology  -Persistent with 4-5 episodes per day, but denies bloody diarrhea, nausea, vomiting, fever, or abdominal pain that persists after episode of diarrhea. By his history he has had some diarrhea in the past, so may be component of IBS versus IBD.  -Stool cultures and C. difficile testing performed on recent antibiotic use.  -Refer to gastroenterology for further evaluation.  -RTC/ER precautions.   No orders of the defined types were placed in this encounter.   Patient Instructions  Continue allegra and zantac if this is helping cough, and albuterol if needed for wheezing.  If cough is not improving in next few weeks - return for possible xray. Return to the clinic or go to the nearest emergency room if any of your symptoms worsen or new symptoms occur.  Return with tests for infectious causes of diarrhea. I am also referring you to a gastroenterologist as this has happened in the past to determine if other testing needed. If you have fever, nausea/vomiting or any abdominal pain that does not resolve with bowel movement - return for other testing. Return to the clinic or go to the nearest emergency room if any of your symptoms worsen or new symptoms occur.  Diarrhea Diarrhea is frequent loose and watery bowel movements. It can cause you to feel weak and dehydrated. Dehydration can cause you to become tired and thirsty, have a dry mouth, and have decreased urination that often is dark yellow. Diarrhea is a sign of another problem, most often an infection that will not last long. In most cases, diarrhea typically lasts 2-3 days. However, it  can last longer if it is a sign of something more serious. It is important to treat your diarrhea as directed by your caregiver to lessen or prevent future episodes of diarrhea. CAUSES  Some common causes include:  Gastrointestinal infections caused by viruses, bacteria, or parasites.  Food poisoning or food allergies.  Certain medicines, such as antibiotics, chemotherapy, and laxatives.  Artificial sweeteners and fructose.  Digestive disorders. HOME CARE INSTRUCTIONS  Ensure adequate fluid intake (hydration): Have 1 cup (8 oz) of fluid for each diarrhea episode. Avoid fluids that contain simple sugars or sports drinks, fruit juices, whole milk products, and sodas. Your urine should be clear or pale yellow if you are drinking enough fluids. Hydrate with an oral rehydration solution that you can purchase at pharmacies, retail stores, and online. You can prepare an oral rehydration solution at home by mixing the following ingredients together:   - tsp table salt.   tsp baking soda.   tsp salt substitute containing potassium chloride.  1  tablespoons sugar.  1 L (34 oz) of water.  Certain foods and beverages may increase the speed at which food moves through the gastrointestinal (GI) tract. These foods and beverages should be avoided and include:  Caffeinated and alcoholic beverages.  High-fiber foods, such as raw fruits and vegetables, nuts, seeds, and whole grain breads and cereals.  Foods and beverages sweetened with sugar alcohols, such as xylitol, sorbitol, and mannitol.  Some foods may be well tolerated and may help thicken stool including:  Starchy foods, such as rice, toast, pasta, low-sugar cereal, oatmeal, grits, baked potatoes, crackers, and bagels.  Bananas.  Applesauce.  Add probiotic-rich foods to help increase healthy bacteria in the GI tract, such as yogurt and fermented milk products.  Wash your hands well after each diarrhea episode.  Only take  over-the-counter or prescription medicines as directed by your caregiver.  Take a warm bath to relieve any burning or pain from frequent diarrhea episodes. SEEK IMMEDIATE MEDICAL CARE IF:   You are unable to keep fluids down.  You have persistent vomiting.  You have blood in your stool, or your stools are black and tarry.  You do not urinate in 6-8 hours, or there is only a small amount of very dark urine.  You have abdominal pain that increases or localizes.  You have weakness, dizziness, confusion, or light-headedness.  You have a severe headache.  Your diarrhea gets worse or does not get better.  You have a fever or persistent symptoms for more than 2-3 days.  You have a fever and your symptoms suddenly get worse. MAKE SURE YOU:   Understand these instructions.  Will watch your condition.  Will get help right away if you are not doing well or get worse. Document Released: 01/18/2002 Document Revised: 06/14/2013 Document Reviewed: 10/06/2011 [facility] Patient Information 2015 [place], [place]. This information is not intended to replace advice given to you by your health care provider. Make sure you discuss any questions you have with your health care provider.     I personally performed the services described in this documentation, which was scribed in my presence. The recorded information has been reviewed and considered, and addended by me as needed.

## 2014-10-03 NOTE — Patient Instructions (Signed)
Continue allegra and zantac if this is helping cough, and albuterol if needed for wheezing.  If cough is not improving in next few weeks - return for possible xray. Return to the clinic or go to the nearest emergency room if any of your symptoms worsen or new symptoms occur.  Return with tests for infectious causes of diarrhea. I am also referring you to a gastroenterologist as this has happened in the past to determine if other testing needed. If you have fever, nausea/vomiting or any abdominal pain that does not resolve with bowel movement - return for other testing. Return to the clinic or go to the nearest emergency room if any of your symptoms worsen or new symptoms occur.  Diarrhea Diarrhea is frequent loose and watery bowel movements. It can cause you to feel weak and dehydrated. Dehydration can cause you to become tired and thirsty, have a dry mouth, and have decreased urination that often is dark yellow. Diarrhea is a sign of another problem, most often an infection that will not last long. In most cases, diarrhea typically lasts 2-3 days. However, it can last longer if it is a sign of something more serious. It is important to treat your diarrhea as directed by your caregiver to lessen or prevent future episodes of diarrhea. CAUSES  Some common causes include:  Gastrointestinal infections caused by viruses, bacteria, or parasites.  Food poisoning or food allergies.  Certain medicines, such as antibiotics, chemotherapy, and laxatives.  Artificial sweeteners and fructose.  Digestive disorders. HOME CARE INSTRUCTIONS  Ensure adequate fluid intake (hydration): Have 1 cup (8 oz) of fluid for each diarrhea episode. Avoid fluids that contain simple sugars or sports drinks, fruit juices, whole milk products, and sodas. Your urine should be clear or pale yellow if you are drinking enough fluids. Hydrate with an oral rehydration solution that you can purchase at pharmacies, retail stores, and  online. You can prepare an oral rehydration solution at home by mixing the following ingredients together:   - tsp table salt.   tsp baking soda.   tsp salt substitute containing potassium chloride.  1  tablespoons sugar.  1 L (34 oz) of water.  Certain foods and beverages may increase the speed at which food moves through the gastrointestinal (GI) tract. These foods and beverages should be avoided and include:  Caffeinated and alcoholic beverages.  High-fiber foods, such as raw fruits and vegetables, nuts, seeds, and whole grain breads and cereals.  Foods and beverages sweetened with sugar alcohols, such as xylitol, sorbitol, and mannitol.  Some foods may be well tolerated and may help thicken stool including:  Starchy foods, such as rice, toast, pasta, low-sugar cereal, oatmeal, grits, baked potatoes, crackers, and bagels.  Bananas.  Applesauce.  Add probiotic-rich foods to help increase healthy bacteria in the GI tract, such as yogurt and fermented milk products.  Wash your hands well after each diarrhea episode.  Only take over-the-counter or prescription medicines as directed by your caregiver.  Take a warm bath to relieve any burning or pain from frequent diarrhea episodes. SEEK IMMEDIATE MEDICAL CARE IF:   You are unable to keep fluids down.  You have persistent vomiting.  You have blood in your stool, or your stools are black and tarry.  You do not urinate in 6-8 hours, or there is only a small amount of very dark urine.  You have abdominal pain that increases or localizes.  You have weakness, dizziness, confusion, or light-headedness.  You have a severe  headache.  Your diarrhea gets worse or does not get better.  You have a fever or persistent symptoms for more than 2-3 days.  You have a fever and your symptoms suddenly get worse. MAKE SURE YOU:   Understand these instructions.  Will watch your condition.  Will get help right away if you are not  doing well or get worse. Document Released: 01/18/2002 Document Revised: 06/14/2013 Document Reviewed: 10/06/2011 [facility] Patient Information 2015 [place], [place]. This information is not intended to replace advice given to you by your health care provider. Make sure you discuss any questions you have with your health care provider.

## 2014-10-04 ENCOUNTER — Encounter: Payer: Self-pay | Admitting: Gastroenterology

## 2014-10-10 LAB — CLOSTRIDIUM DIFFICILE BY PCR: Toxigenic C. Difficile by PCR: NOT DETECTED

## 2014-10-12 ENCOUNTER — Encounter: Payer: Self-pay | Admitting: Family Medicine

## 2014-10-13 LAB — STOOL CULTURE

## 2014-10-18 ENCOUNTER — Telehealth: Payer: Self-pay

## 2014-10-18 NOTE — Telephone Encounter (Signed)
Pt called about labs. Let him know stool tests were neg. Still feeling about the same. Has GI appt tomorrow

## 2014-12-02 ENCOUNTER — Ambulatory Visit: Payer: Managed Care, Other (non HMO) | Admitting: Gastroenterology

## 2015-04-14 ENCOUNTER — Ambulatory Visit (INDEPENDENT_AMBULATORY_CARE_PROVIDER_SITE_OTHER): Payer: Managed Care, Other (non HMO) | Admitting: Family Medicine

## 2015-04-14 ENCOUNTER — Ambulatory Visit (INDEPENDENT_AMBULATORY_CARE_PROVIDER_SITE_OTHER): Payer: Managed Care, Other (non HMO)

## 2015-04-14 VITALS — BP 120/68 | HR 73 | Temp 98.2°F | Resp 14 | Ht 67.0 in | Wt 148.0 lb

## 2015-04-14 DIAGNOSIS — J302 Other seasonal allergic rhinitis: Secondary | ICD-10-CM

## 2015-04-14 DIAGNOSIS — M542 Cervicalgia: Secondary | ICD-10-CM

## 2015-04-14 DIAGNOSIS — Z23 Encounter for immunization: Secondary | ICD-10-CM | POA: Diagnosis not present

## 2015-04-14 DIAGNOSIS — R51 Headache: Secondary | ICD-10-CM

## 2015-04-14 DIAGNOSIS — R519 Headache, unspecified: Secondary | ICD-10-CM

## 2015-04-14 MED ORDER — FLUTICASONE PROPIONATE 50 MCG/ACT NA SUSP: 2.0000 | Freq: Every day | NASAL | Status: DC

## 2015-04-14 MED ORDER — MONTELUKAST SODIUM 10 MG PO TABS: 10.0000 mg | ORAL_TABLET | Freq: Every day | ORAL | Status: DC

## 2015-04-14 MED ORDER — METHYLPREDNISOLONE ACETATE 80 MG/ML IJ SUSP
80.0000 mg | Freq: Once | INTRAMUSCULAR | Status: AC
  Administered 2015-04-14: 80 mg via INTRAMUSCULAR

## 2015-04-14 MED ORDER — DICLOFENAC SODIUM 75 MG PO TBEC: 75.0000 mg | DELAYED_RELEASE_TABLET | Freq: Two times a day (BID) | ORAL | Status: DC

## 2015-04-14 MED ORDER — METHOCARBAMOL 500 MG PO TABS: 500.0000 mg | ORAL_TABLET | Freq: Four times a day (QID) | ORAL | Status: DC

## 2015-04-14 NOTE — Progress Notes (Signed)
Patient ID: Alvin Walker, male    DOB: Feb 27, 1975  Age: 40 y.o. MRN: [id number]  Chief Complaint  Patient presents with  . Immunotherapy  . Neck Pain    Subjective:   Patient is here for a couple of things. He is been having a chronic and recurring problems with right-sided neck pain and headache around toward the right temple. It hurts in the neck behind the ear. Sometimes popping his neck makes it feel worse. No known neck injuries  He has problems with chronic allergic rhinitis. He has tried over-the-counter antihistamine decongestant combinations. Currently he is using Afrin a couple of times a night because he is so congested. It is been real bad the last few weeks. He does not smoke. He has used Flonase, not regularly recently. He says this is a bad bout of it, and he would like to consider a shot for the allergies.  Current allergies, medications, problem list, past/family and social histories reviewed.  Objective:  BP 120/68 mmHg  Pulse 73  Temp(Src) 98.2 F (36.8 C) (Oral)  Resp 14  Ht 5\' 7"  (1.702 m)  Wt 148 lb (67.132 kg)  BMI 23.17 kg/m2  SpO2 99%  No major acute distress. He little bit tender upon his right ear. Neck has good range of motion. Eyes PERRLA. Throat clear. Chest clear to auscultation. Heart regular without a murmur  Assessment & Plan:   Assessment: 1. Cervical pain (neck)   2. Acute nonintractable headache, unspecified headache type   3. Seasonal allergic rhinitis   4. Needs flu shot       Plan: See instructions below. X-ray the neck.  Orders Placed This Encounter  Procedures  . DG Cervical Spine 2 or 3 views    Order Specific Question:  Reason for Exam (SYMPTOM  OR DIAGNOSIS REQUIRED)    Answer:  right cervical pain, recurrent    Order Specific Question:  Preferred imaging location?    Answer:  External  . Flu Vaccine QUAD 36+ mos IM    Meds ordered this encounter  Medications  . montelukast (SINGULAIR) 10 MG tablet    Sig: Take 1  tablet (10 mg total) by mouth at bedtime.    Dispense:  30 tablet    Refill:  6  . fluticasone (FLONASE) 50 MCG/ACT nasal spray    Sig: Place 2 sprays into both nostrils daily.    Dispense:  16 g    Refill:  6  . methylPREDNISolone acetate (DEPO-MEDROL) injection 80 mg    Sig:   . diclofenac (VOLTAREN) 75 MG EC tablet    Sig: Take 1 tablet (75 mg total) by mouth 2 (two) times daily.    Dispense:  30 tablet    Refill:  0  . methocarbamol (ROBAXIN) 500 MG tablet    Sig: Take 1 tablet (500 mg total) by mouth 4 (four) times daily.    Dispense:  30 tablet    Refill:  1    X-ray of neck appears normal on my interpretation. The radiologist is now red and also when he does not see any abnormality either.  Will treat the neck with muscle relaxant and anti-inflammatory therapy as needed.  See the instructions below regarding allergy treatment. Hopefully Singulair added to the antihistamine and Flonase will do the job.     Patient Instructions  Stop using the Afrin  We are giving you a shot of Depo-Medrol (cortisone) today that should help for the next week or so  Continue taking  either Zyrtec (cetirizine) or Claritin (loratadine) or Allegra (fexofenadine) 1 daily. Often the antihistamines will work for a while and then ceased to work as well, and switching to one of the other brands helps. You can rotate around and eventually get back to the original one and sometimes that system works.  Take the Flonase (fluticasone) nose spray 2 sprays each nostril once daily  Take the Singulair(montelukast) 1 at bedtime for allergy  If worse wheezing or the above medicines are not helping come back.  For the headache and neck pain: Diclofenac 75 mg one twice daily on an as-needed basis for pain and inflammation Methocarbamol 500 mg 1 or 2 pills maximum of 3 times daily if needed for the headache and neck pain  Return if not improving  You'll receive the influenza vaccine today    Because  you received an x-ray today, you will receive an invoice from [facility]. Please contact [facility] at [phone] with questions or concerns regarding your invoice. Our billing staff will not be able to assist you with those questions.      Return if symptoms worsen or fail to improve.   HOPPER,DAVID, MD 04/14/2015

## 2015-04-14 NOTE — Patient Instructions (Addendum)
Stop using the Afrin  We are giving you a shot of Depo-Medrol (cortisone) today that should help for the next week or so  Continue taking either Zyrtec (cetirizine) or Claritin (loratadine) or Allegra (fexofenadine) 1 daily. Often the antihistamines will work for a while and then ceased to work as well, and switching to one of the other brands helps. You can rotate around and eventually get back to the original one and sometimes that system works.  Take the Flonase (fluticasone) nose spray 2 sprays each nostril once daily  Take the Singulair(montelukast) 1 at bedtime for allergy  If worse wheezing or the above medicines are not helping come back.  For the headache and neck pain: Diclofenac 75 mg one twice daily on an as-needed basis for pain and inflammation Methocarbamol 500 mg 1 or 2 pills maximum of 3 times daily if needed for the headache and neck pain  Return if not improving  You'll receive the influenza vaccine today    Because you received an x-ray today, you will receive an invoice from [facility]. Please contact [facility] at [phone] with questions or concerns regarding your invoice. Our billing staff will not be able to assist you with those questions.

## 2015-04-14 NOTE — Progress Notes (Deleted)
Patient ID: Alvin Walker, male    DOB: 07-12-1975  Age: 40 y.o. MRN: [id number]  Chief Complaint  Patient presents with  . Immunotherapy  . Neck Pain    Subjective:   ***  Current allergies, medications, problem list, past/family and social histories reviewed.  Objective:  BP 120/68 mmHg  Pulse 73  Temp(Src) 98.2 F (36.8 C) (Oral)  Resp 14  Ht 5\' 7"  (1.702 m)  Wt 148 lb (67.132 kg)  BMI 23.17 kg/m2  SpO2 99%  ***  Assessment & Plan:   Assessment: No diagnosis found.    Plan: ***  No orders of the defined types were placed in this encounter.    No orders of the defined types were placed in this encounter.         There are no Patient Instructions on file for this visit.   No Follow-up on file.   Davied Nocito, MD 04/14/2015

## 2017-04-10 ENCOUNTER — Encounter: Payer: Self-pay | Admitting: Urgent Care

## 2017-04-10 ENCOUNTER — Other Ambulatory Visit: Payer: Self-pay

## 2017-04-10 ENCOUNTER — Ambulatory Visit (INDEPENDENT_AMBULATORY_CARE_PROVIDER_SITE_OTHER): Payer: 59 | Admitting: Urgent Care

## 2017-04-10 VITALS — BP 112/73 | HR 88 | Temp 98.3°F | Resp 16 | Ht 67.0 in | Wt 158.4 lb

## 2017-04-10 DIAGNOSIS — R0981 Nasal congestion: Secondary | ICD-10-CM | POA: Diagnosis not present

## 2017-04-10 DIAGNOSIS — J310 Chronic rhinitis: Secondary | ICD-10-CM

## 2017-04-10 DIAGNOSIS — T485X1A Poisoning by other anti-common-cold drugs, accidental (unintentional), initial encounter: Secondary | ICD-10-CM | POA: Diagnosis not present

## 2017-04-10 DIAGNOSIS — J3489 Other specified disorders of nose and nasal sinuses: Secondary | ICD-10-CM | POA: Diagnosis not present

## 2017-04-10 DIAGNOSIS — J3089 Other allergic rhinitis: Secondary | ICD-10-CM | POA: Diagnosis not present

## 2017-04-10 DIAGNOSIS — R05 Cough: Secondary | ICD-10-CM

## 2017-04-10 DIAGNOSIS — T485X5A Adverse effect of other anti-common-cold drugs, initial encounter: Principal | ICD-10-CM

## 2017-04-10 DIAGNOSIS — R059 Cough, unspecified: Secondary | ICD-10-CM

## 2017-04-10 MED ORDER — AMOXICILLIN 875 MG PO TABS: 875.0000 mg | ORAL_TABLET | Freq: Two times a day (BID) | ORAL | 0 refills | Status: DC

## 2017-04-10 MED ORDER — CETIRIZINE HCL 10 MG PO TABS: 10.0000 mg | ORAL_TABLET | Freq: Every day | ORAL | 3 refills | Status: DC

## 2017-04-10 MED ORDER — METHYLPREDNISOLONE ACETATE 80 MG/ML IJ SUSP
80.0000 mg | Freq: Once | INTRAMUSCULAR | Status: AC
  Administered 2017-04-10: 80 mg via INTRAMUSCULAR

## 2017-04-10 MED ORDER — PSEUDOEPHEDRINE HCL ER 120 MG PO TB12: 120.0000 mg | ORAL_TABLET | Freq: Two times a day (BID) | ORAL | 3 refills | Status: DC

## 2017-04-10 MED ORDER — MONTELUKAST SODIUM 10 MG PO TABS
10.0000 mg | ORAL_TABLET | Freq: Every day | ORAL | 6 refills | Status: DC
Start: 2017-04-10 — End: 2018-04-04

## 2017-04-10 NOTE — Progress Notes (Signed)
  MRN: [id number] DOB: 07-24-1975  Subjective:   Alvin Walker is a 42 y.o. male presenting for more than 1 week history of nasal congestion. Had sinus pain earlier this week but is now resolved. Has been using Zyrtec, Sudafed. Now has been using Afrin every day. Denies fever, throat pain, chest pain, shob, wheezing, n/v, abdominal pain. Denies smoking cigarettes.  Alvin Walker has a current medication list which includes the following prescription(s): albuterol, diclofenac, fluticasone, methocarbamol, and montelukast. Also has No Known Allergies.  Alvin Walker  has a past medical history of Allergy. Denies past surgical history.   Objective:   Vitals: BP 112/73 (BP Location: Right Arm, Patient Position: Sitting, Cuff Size: Normal)   Pulse 88   Temp 98.3 F (36.8 C) (Oral)   Resp 16   Ht 5\' 7"  (1.702 m)   Wt 158 lb 6.4 oz (71.8 kg)   SpO2 98%   BMI 24.81 kg/m   Physical Exam  Constitutional: He is oriented to person, place, and time. He appears well-developed and well-nourished.  HENT:  TM's intact bilaterally, no effusions or erythema. Nasal turbinates boggy, violaceous, nasal passages not patent. Bilateral maxillary sinus tenderness. Oropharynx clear, mucous membranes moist.    Eyes: Right eye exhibits no discharge. Left eye exhibits no discharge.  Neck: Normal range of motion. Neck supple.  Cardiovascular: Normal rate, regular rhythm and intact distal pulses. Exam reveals no gallop and no friction rub.  No murmur heard. Pulmonary/Chest: No respiratory distress. He has no wheezes. He has no rales.  Lymphadenopathy:    He has no cervical adenopathy.  Neurological: He is alert and oriented to person, place, and time.  Skin: Skin is warm and dry.  Psychiatric: He has a normal mood and affect.    Assessment and Plan :   Rhinitis medicamentosa - Plan: methylPREDNISolone acetate (DEPO-MEDROL) injection 80 mg, Ambulatory referral to Allergy  Seasonal allergic rhinitis due to other allergic  trigger - Plan: montelukast (SINGULAIR) 10 MG tablet, methylPREDNISolone acetate (DEPO-MEDROL) injection 80 mg, Ambulatory referral to Allergy  Sinus pain  Sinus congestion - Plan: methylPREDNISolone acetate (DEPO-MEDROL) injection 80 mg  Cough  Stop Afrin. Restart Zyrtec, Sudafed, Singulair. IM Depo-medrol today. Return-to-clinic precautions discussed, patient verbalized understanding. Referral to allergy clinic is pending.  Wallis BambergMario Zandria Woldt, PA-C Primary Care at [facility] [phone] 04/10/2017  4:44 PM

## 2017-04-10 NOTE — Patient Instructions (Signed)
Allergic Rhinitis, Adult Allergic rhinitis is an allergic reaction that affects the mucous membrane inside the nose. It causes sneezing, a runny or stuffy nose, and the feeling of mucus going down the back of the throat (postnasal drip). Allergic rhinitis can be mild to severe. There are two types of allergic rhinitis:  Seasonal. This type is also called hay fever. It happens only during certain seasons.  Perennial. This type can happen at any time of the year.  What are the causes? This condition happens when the body's defense system (immune system) responds to certain harmless substances called allergens as though they were germs.  Seasonal allergic rhinitis is triggered by pollen, which can come from grasses, trees, and weeds. Perennial allergic rhinitis may be caused by:  House dust mites.  Pet dander.  Mold spores.  What are the signs or symptoms? Symptoms of this condition include:  Sneezing.  Runny or stuffy nose (nasal congestion).  Postnasal drip.  Itchy nose.  Tearing of the eyes.  Trouble sleeping.  Daytime sleepiness.  How is this diagnosed? This condition may be diagnosed based on:  Your medical history.  A physical exam.  Tests to check for related conditions, such as: ? Asthma. ? Pink eye. ? Ear infection. ? Upper respiratory infection.  Tests to find out which allergens trigger your symptoms. These may include skin or blood tests.  How is this treated? There is no cure for this condition, but treatment can help control symptoms. Treatment may include:  Taking medicines that block allergy symptoms, such as antihistamines. Medicine may be given as a shot, nasal spray, or pill.  Avoiding the allergen.  Desensitization. This treatment involves getting ongoing shots until your body becomes less sensitive to the allergen. This treatment may be done if other treatments do not help.  If taking medicine and avoiding the allergen does not work, new,  stronger medicines may be prescribed.  Follow these instructions at home:  Find out what you are allergic to. Common allergens include smoke, dust, and pollen.  Avoid the things you are allergic to. These are some things you can do to help avoid allergens: ? Replace carpet with wood, tile, or vinyl flooring. Carpet can trap dander and dust. ? Do not smoke. Do not allow smoking in your home. ? Change your heating and air conditioning filter at least once a month. ? During allergy season:  Keep windows closed as much as possible.  Plan outdoor activities when pollen counts are lowest. This is usually during the evening hours.  When coming indoors, change clothing and shower before sitting on furniture or bedding.  Take over-the-counter and prescription medicines only as told by your health care provider.  Keep all follow-up visits as told by your health care provider. This is important. Contact a health care provider if:  You have a fever.  You develop a persistent cough.  You make whistling sounds when you breathe (you wheeze).  Your symptoms interfere with your normal daily activities. Get help right away if:  You have shortness of breath. Summary  This condition can be managed by taking medicines as directed and avoiding allergens.  Contact your health care provider if you develop a persistent cough or fever.  During allergy season, keep windows closed as much as possible. This information is not intended to replace advice given to you by your health care provider. Make sure you discuss any questions you have with your health care provider. Document Released: 10/23/2000 Document Revised: 03/07/2016  Document Reviewed: 03/07/2016 Elsevier Interactive Patient Education  2018 [employer]     Sinusitis, Adult Sinusitis is soreness and inflammation of your sinuses. Sinuses are hollow spaces in the bones around your face. Your sinuses are located:  Around your  eyes.  In the middle of your forehead.  Behind your nose.  In your cheekbones.  Your sinuses and nasal passages are lined with a stringy fluid (mucus). Mucus normally drains out of your sinuses. When your nasal tissues become inflamed or swollen, the mucus can become trapped or blocked so air cannot flow through your sinuses. This allows bacteria, viruses, and funguses to grow, which leads to infection. Sinusitis can develop quickly and last for 7?10 days (acute) or for more than 12 weeks (chronic). Sinusitis often develops after a cold. What are the causes? This condition is caused by anything that creates swelling in the sinuses or stops mucus from draining, including:  Allergies.  Asthma.  Bacterial or viral infection.  Abnormally shaped bones between the nasal passages.  Nasal growths that contain mucus (nasal polyps).  Narrow sinus openings.  Pollutants, such as chemicals or irritants in the air.  A foreign object stuck in the nose.  A fungal infection. This is rare.  What increases the risk? The following factors may make you more likely to develop this condition:  Having allergies or asthma.  Having had a recent cold or respiratory tract infection.  Having structural deformities or blockages in your nose or sinuses.  Having a weak immune system.  Doing a lot of swimming or diving.  Overusing nasal sprays.  Smoking.  What are the signs or symptoms? The main symptoms of this condition are pain and a feeling of pressure around the affected sinuses. Other symptoms include:  Upper toothache.  Earache.  Headache.  Bad breath.  Decreased sense of smell and taste.  A cough that may get worse at night.  Fatigue.  Fever.  Thick drainage from your nose. The drainage is often green and it may contain pus (purulent).  Stuffy nose or congestion.  Postnasal drip. This is when extra mucus collects in the throat or back of the nose.  Swelling and warmth  over the affected sinuses.  Sore throat.  Sensitivity to light.  How is this diagnosed? This condition is diagnosed based on symptoms, a medical history, and a physical exam. To find out if your condition is acute or chronic, your health care provider may:  Look in your nose for signs of nasal polyps.  Tap over the affected sinus to check for signs of infection.  View the inside of your sinuses using an imaging device that has a light attached (endoscope).  If your health care provider suspects that you have chronic sinusitis, you may also:  Be tested for allergies.  Have a sample of mucus taken from your nose (nasal culture) and checked for bacteria.  Have a mucus sample examined to see if your sinusitis is related to an allergy.  If your sinusitis does not respond to treatment and it lasts longer than 8 weeks, you may have an MRI or CT scan to check your sinuses. These scans also help to determine how severe your infection is. In rare cases, a bone biopsy may be done to rule out more serious types of fungal sinus disease. How is this treated? Treatment for sinusitis depends on the cause and whether your condition is chronic or acute. If a virus is causing your sinusitis, your symptoms will go  away on their own within 10 days. You may be given medicines to relieve your symptoms, including:  Topical nasal decongestants. They shrink swollen nasal passages and let mucus drain from your sinuses.  Antihistamines. These drugs block inflammation that is triggered by allergies. This can help to ease swelling in your nose and sinuses.  Topical nasal corticosteroids. These are nasal sprays that ease inflammation and swelling in your nose and sinuses.  Nasal saline washes. These rinses can help to get rid of thick mucus in your nose.  If your condition is caused by bacteria, you will be given an antibiotic medicine. If your condition is caused by a fungus, you will be given an antifungal  medicine. Surgery may be needed to correct underlying conditions, such as narrow nasal passages. Surgery may also be needed to remove polyps. Follow these instructions at home: Medicines  Take, use, or apply over-the-counter and prescription medicines only as told by your health care provider. These may include nasal sprays.  If you were prescribed an antibiotic medicine, take it as told by your health care provider. Do not stop taking the antibiotic even if you start to feel better. Hydrate and Humidify  Drink enough water to keep your urine clear or pale yellow. Staying hydrated will help to thin your mucus.  Use a cool mist humidifier to keep the humidity level in your home above 50%.  Inhale steam for 10-15 minutes, 3-4 times a day or as told by your health care provider. You can do this in the bathroom while a hot shower is running.  Limit your exposure to cool or dry air. Rest  Rest as much as possible.  Sleep with your head raised (elevated).  Make sure to get enough sleep each night. General instructions  Apply a warm, moist washcloth to your face 3-4 times a day or as told by your health care provider. This will help with discomfort.  Wash your hands often with soap and water to reduce your exposure to viruses and other germs. If soap and water are not available, use hand sanitizer.  Do not smoke. Avoid being around people who are smoking (secondhand smoke).  Keep all follow-up visits as told by your health care provider. This is important. Contact a health care provider if:  You have a fever.  Your symptoms get worse.  Your symptoms do not improve within 10 days. Get help right away if:  You have a severe headache.  You have persistent vomiting.  You have pain or swelling around your face or eyes.  You have vision problems.  You develop confusion.  Your neck is stiff.  You have trouble breathing. This information is not intended to replace advice given  to you by your health care provider. Make sure you discuss any questions you have with your health care provider. Document Released: 01/28/2005 Document Revised: 09/24/2015 Document Reviewed: 11/23/2014 Elsevier Interactive Patient Education  [employer]2018 [employer] Inc[employer]

## 2017-05-23 ENCOUNTER — Ambulatory Visit: Payer: 59 | Admitting: Allergy

## 2017-12-02 IMAGING — CR DG CERVICAL SPINE 2 OR 3 VIEWS
3 series · 3 of 3 positions shown · non-contrast
Comparison: Cervical spine series of November 18, 2008

CLINICAL DATA: Right-sided neck pain common no known injury.

EXAM:
CERVICAL SPINE - 2-3 VIEW

[lateral]
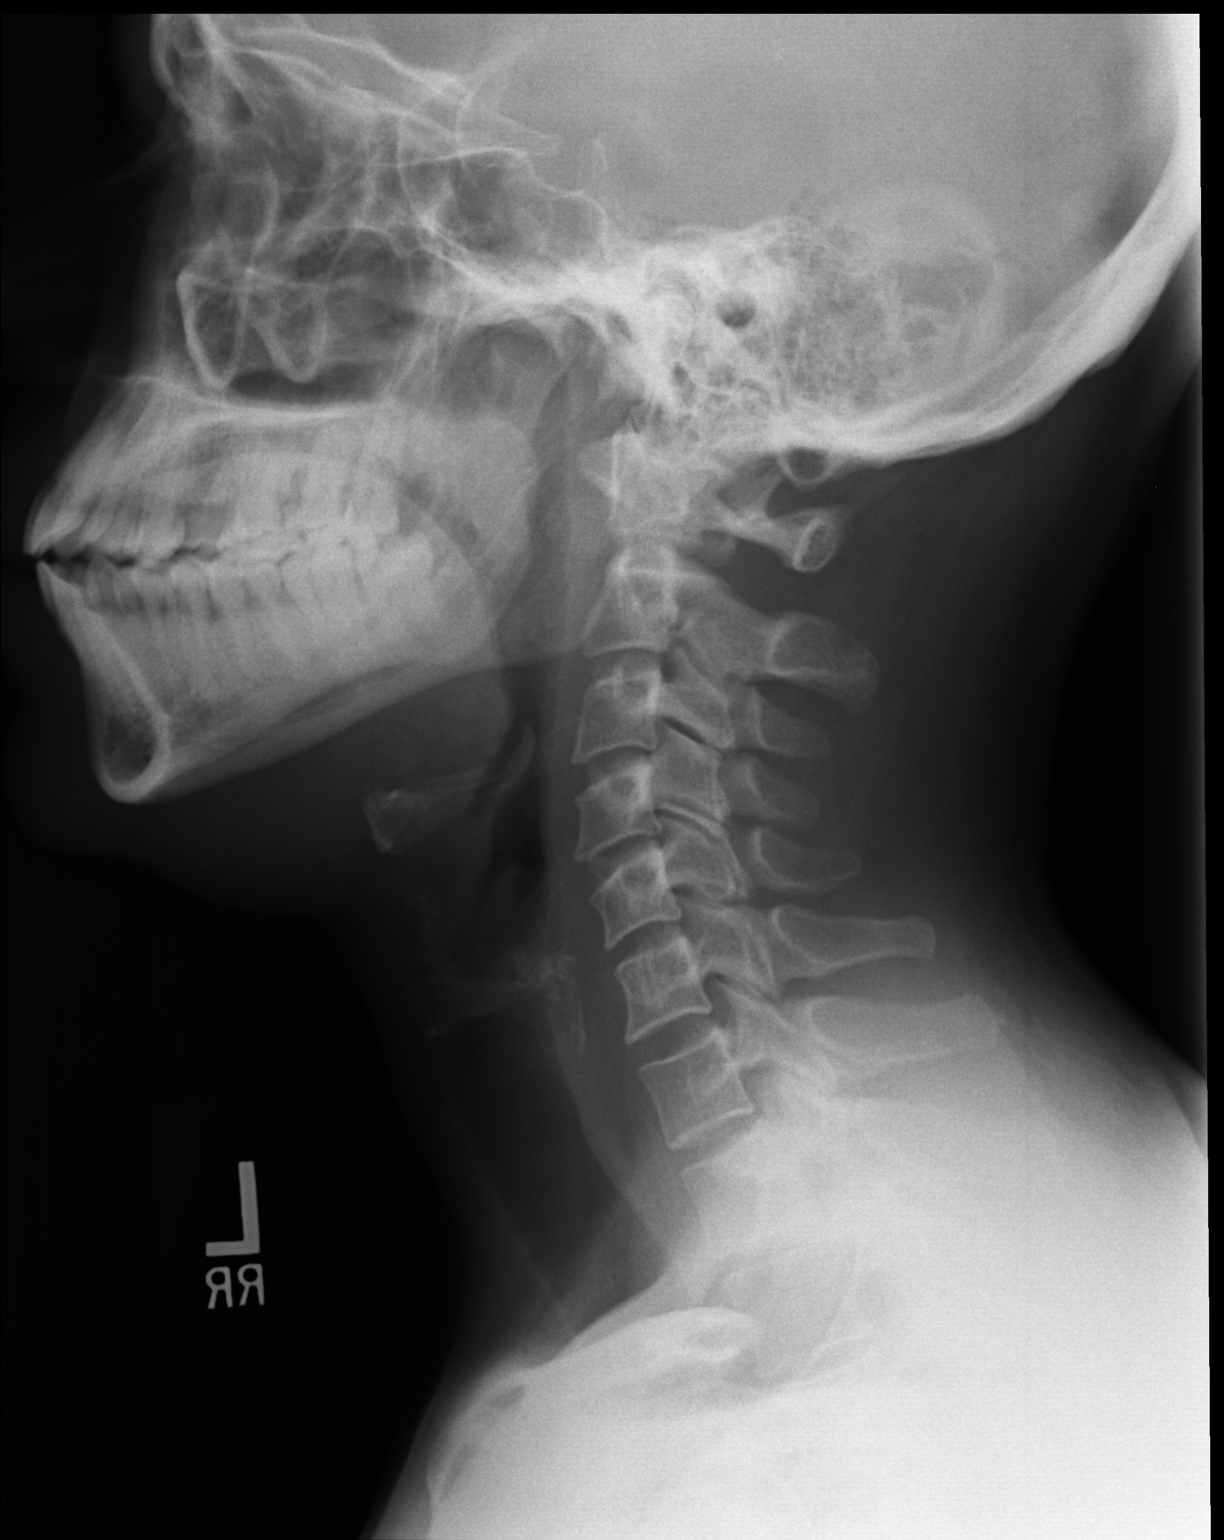

[AP (1 of 2)]
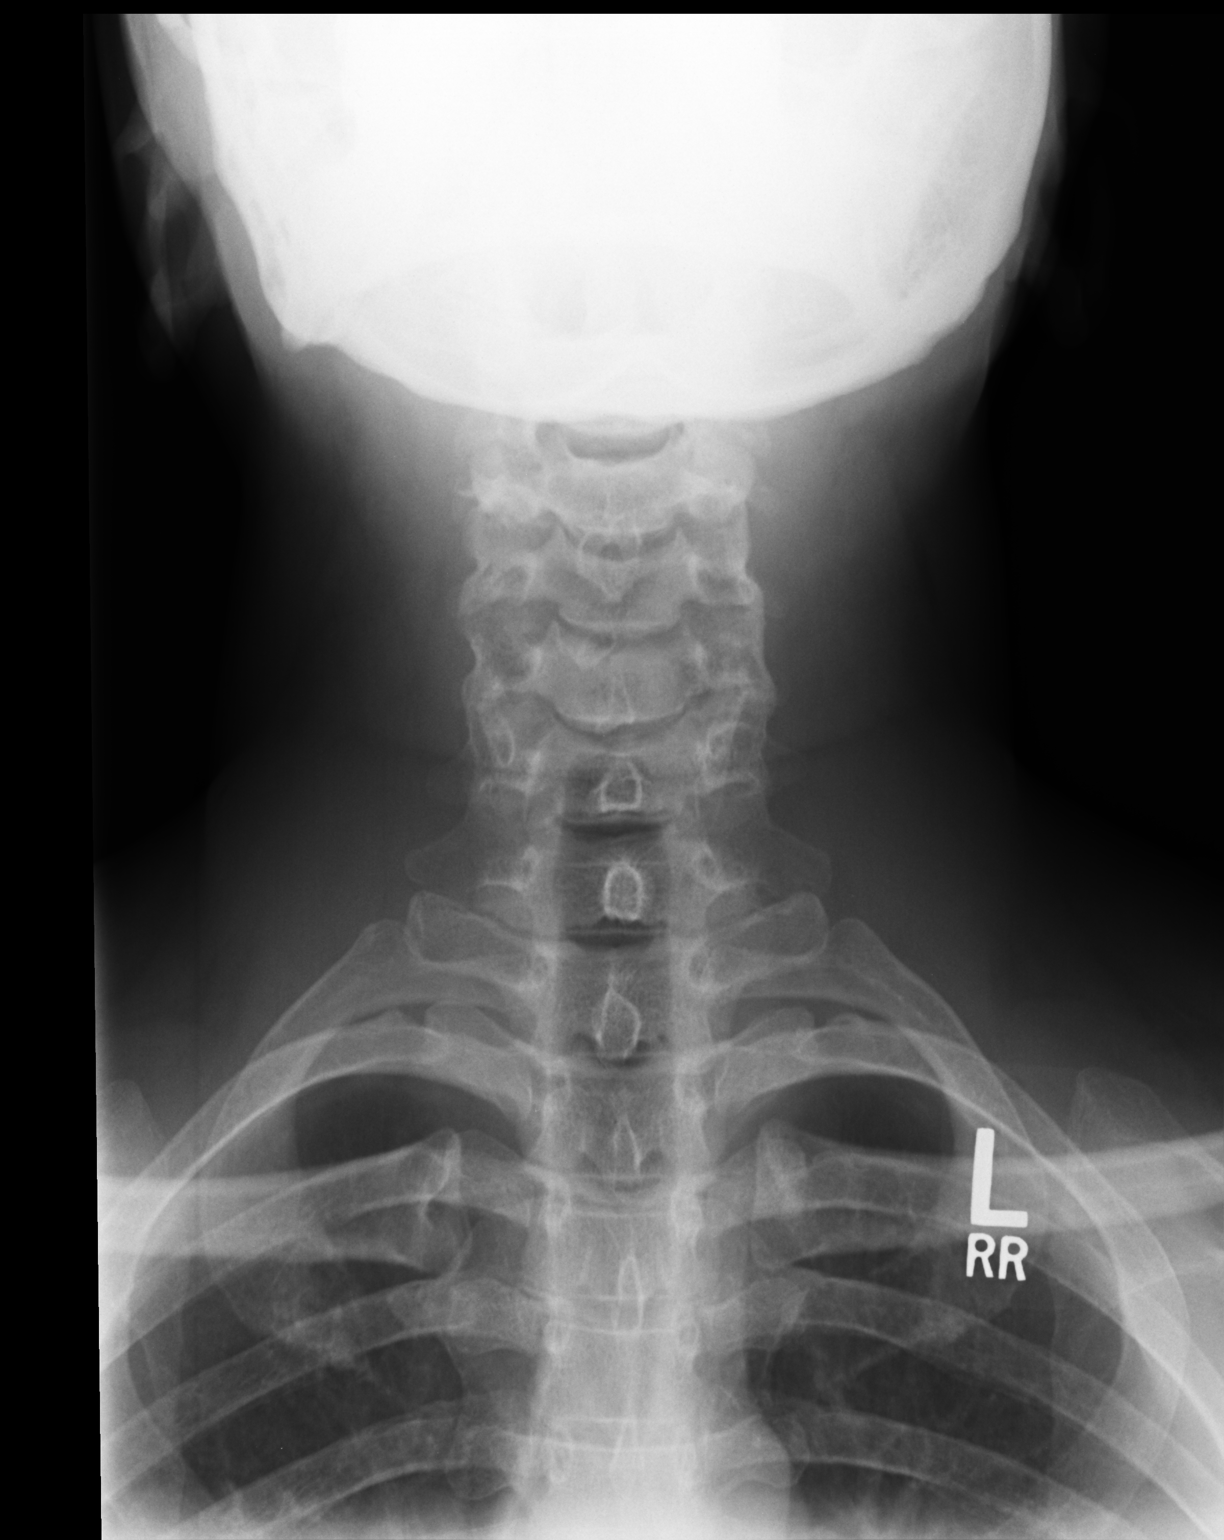

[AP (2 of 2)]
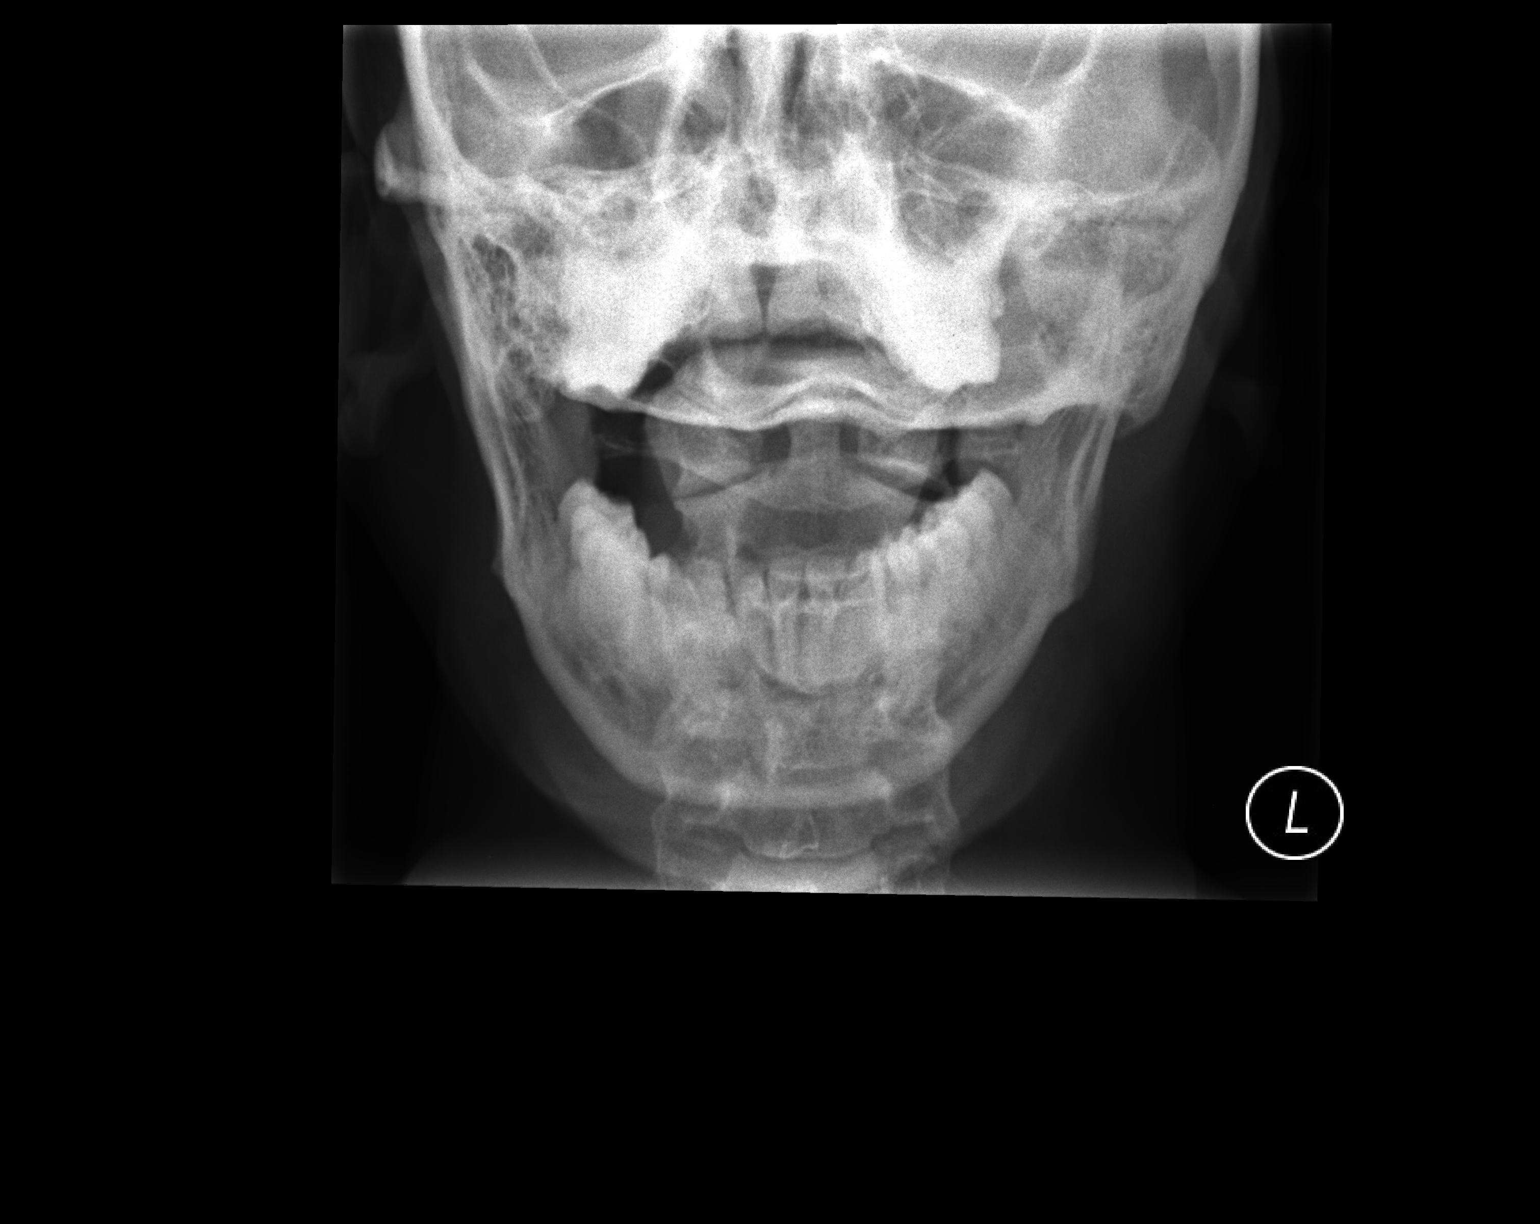

[3 of 3 positions shown; findings below may reference images not displayed]

FINDINGS: The cervical vertebral bodies are preserved in height. The disc
space heights are well maintained. There is no perched facet or
spinous process fracture. The odontoid is intact. The prevertebral
soft tissue spaces are normal.
IMPRESSION: There is no acute or significant chronic bony abnormality of the
cervical spine.

## 2018-04-04 ENCOUNTER — Other Ambulatory Visit: Payer: Self-pay

## 2018-04-04 ENCOUNTER — Encounter: Payer: Self-pay | Admitting: Family Medicine

## 2018-04-04 ENCOUNTER — Ambulatory Visit: Payer: Managed Care, Other (non HMO) | Admitting: Family Medicine

## 2018-04-04 VITALS — BP 120/78 | HR 74 | Temp 98.9°F | Ht 67.0 in | Wt 155.4 lb

## 2018-04-04 DIAGNOSIS — Z09 Encounter for follow-up examination after completed treatment for conditions other than malignant neoplasm: Secondary | ICD-10-CM

## 2018-04-04 DIAGNOSIS — R05 Cough: Secondary | ICD-10-CM | POA: Diagnosis not present

## 2018-04-04 DIAGNOSIS — J3089 Other allergic rhinitis: Secondary | ICD-10-CM | POA: Diagnosis not present

## 2018-04-04 DIAGNOSIS — R059 Cough, unspecified: Secondary | ICD-10-CM

## 2018-04-04 MED ORDER — ALBUTEROL SULFATE HFA 108 (90 BASE) MCG/ACT IN AERS: 1.0000 | INHALATION_SPRAY | RESPIRATORY_TRACT | 3 refills | Status: AC | PRN

## 2018-04-04 MED ORDER — CETIRIZINE HCL 10 MG PO TABS: 10.0000 mg | ORAL_TABLET | Freq: Every day | ORAL | 3 refills | Status: AC

## 2018-04-04 MED ORDER — MONTELUKAST SODIUM 10 MG PO TABS: 10.0000 mg | ORAL_TABLET | Freq: Every day | ORAL | 6 refills | Status: AC

## 2018-04-04 MED ORDER — FLUTICASONE PROPIONATE 50 MCG/ACT NA SUSP: 2.0000 | Freq: Every day | NASAL | 6 refills | Status: AC

## 2018-04-04 MED ORDER — METHYLPREDNISOLONE ACETATE 80 MG/ML IJ SUSP
80.0000 mg | Freq: Once | INTRAMUSCULAR | Status: AC
  Administered 2018-04-04: 80 mg via INTRAMUSCULAR

## 2018-04-04 NOTE — Patient Instructions (Addendum)
If you have lab work done today you will be contacted with your lab results within the next 2 weeks.  If you have not heard from us then please contact us. The fastest way to get your results is to register for My Chart.   IF you received an x-ray today, you will receive an invoice from [facility]. Please contact [facility] at [phone] with questions or concerns regarding your invoice.   IF you received labwork today, you will receive an invoice from [facility]. Please contact [facility] at [phone] with questions or concerns regarding your invoice.   Our billing staff will not be able to assist you with questions regarding bills from these companies.  You will be contacted with the lab results as soon as they are available. The fastest way to get your results is to activate your My Chart account. Instructions are located on the last page of this paperwork. If you have not heard from us regarding the results in 2 weeks, please contact this office.     Albuterol inhalation aerosol What is this medicine? ALBUTEROL (al [address] ole) is a bronchodilator. It helps open up the airways in your lungs to make it easier to breathe. This medicine is used to treat and to prevent bronchospasm. This medicine may be used for other purposes; ask your health care provider or pharmacist if you have questions. COMMON BRAND NAME(S): Proair HFA, Proventil, Proventil HFA, Respirol, Ventolin, Ventolin HFA What should I tell my health care provider before I take this medicine? They need to know if you have any of the following conditions: -diabetes -heart disease or irregular heartbeat -high blood pressure -pheochromocytoma -seizures -thyroid disease -an unusual or allergic reaction to albuterol, levalbuterol, sulfites, other medicines, foods, dyes, or preservatives -pregnant or trying to get pregnant -breast-feeding How should I use this medicine? This medicine is for  inhalation through the mouth. Follow the directions on your prescription label. Take your medicine at regular intervals. Do not use more often than directed. Make sure that you are using your inhaler correctly. Ask you doctor or health care provider if you have any questions. Talk to your pediatrician regarding the use of this medicine in children. Special care may be needed. Overdosage: If you think you have taken too much of this medicine contact a poison control center or emergency room at once. NOTE: This medicine is only for you. Do not share this medicine with others. What if I miss a dose? If you miss a dose, use it as soon as you can. If it is almost time for your next dose, use only that dose. Do not use double or extra doses. What may interact with this medicine? -anti-infectives like chloroquine and pentamidine -caffeine -cisapride -diuretics -medicines for colds -medicines for depression or for emotional or psychotic conditions -medicines for weight loss including some herbal products -methadone -some antibiotics like clarithromycin, erythromycin, levofloxacin, and linezolid -some heart medicines -steroid hormones like dexamethasone, cortisone, hydrocortisone -theophylline -thyroid hormones This list may not describe all possible interactions. Give your health care provider a list of all the medicines, herbs, non-prescription drugs, or dietary supplements you use. Also tell them if you smoke, drink alcohol, or use illegal drugs. Some items may interact with your medicine. What should I watch for while using this medicine? Tell your doctor or health care professional if your symptoms do not improve. Do not use extra albuterol. If your asthma or bronchitis gets worse while you are using this  medicine, call your doctor right away. If your mouth gets dry try chewing sugarless gum or sucking hard candy. Drink water as directed. What side effects may I notice from receiving this  medicine? Side effects that you should report to your doctor or health care professional as soon as possible: -allergic reactions like skin rash, itching or hives, swelling of the face, lips, or tongue -breathing problems -chest pain -feeling faint or lightheaded, falls -high blood pressure -irregular heartbeat -fever -muscle cramps or weakness -pain, tingling, numbness in the hands or feet -vomiting Side effects that usually do not require medical attention (report to your doctor or health care professional if they continue or are bothersome): -cough -difficulty sleeping -headache -nervousness or trembling -stomach upset -stuffy or runny nose -throat irritation -unusual taste This list may not describe all possible side effects. Call your doctor for medical advice about side effects. You may report side effects to FDA at 1-800-FDA-1088. Where should I keep my medicine? Keep out of the reach of children. Store at room temperature between 15 and 30 degrees C (59 and 86 degrees F). The contents are under pressure and may burst when exposed to heat or flame. Do not freeze. This medicine does not work as well if it is too cold. Throw away any unused medicine after the expiration date. Inhalers need to be thrown away after the labeled number of puffs have been used or by the expiration date; whichever comes first. Ventolin HFA should be thrown away 12 months after removing from foil pouch. Check the instructions that come with your medicine. NOTE: This sheet is a summary. It may not cover all possible information. If you have questions about this medicine, talk to your doctor, pharmacist, or health care provider.  2019 Elsevier/Gold Standard (2012-07-16 10:57:17)  Shortness of Breath, Adult Shortness of breath means you have trouble breathing. Shortness of breath could be a sign of a medical problem. Follow these instructions at home:   Watch for any changes in your symptoms.  Do not  use any products that contain nicotine or tobacco, such as cigarettes, e-cigarettes, and chewing tobacco.  Do not smoke. Smoking can cause shortness of breath. If you need help to quit smoking, ask your doctor.  Avoid things that can make it harder to breathe, such as: ? Mold. ? Dust. ? Air pollution. ? Chemical smells. ? Things that can cause allergy symptoms (allergens), if you have allergies.  Keep your living space clean. Use products that help remove mold and dust.  Rest as needed. Slowly return to your normal activities.  Take over-the-counter and prescription medicines only as told by your doctor. This includes oxygen therapy and inhaled medicines.  Keep all follow-up visits as told by your doctor. This is important. Contact a doctor if:  Your condition does not get better as soon as expected.  You have a hard time doing your normal activities, even after you rest.  You have new symptoms. Get help right away if:  Your shortness of breath gets worse.  You have trouble breathing when you are resting.  You feel light-headed or you pass out (faint).  You have a cough that is not helped by medicines.  You cough up blood.  You have pain with breathing.  You have pain in your chest, arms, shoulders, or belly (abdomen).  You have a fever.  You cannot walk up stairs.  You cannot exercise the way you normally do. These symptoms may represent a serious problem that is  an emergency. Do not wait to see if the symptoms will go away. Get medical help right away. Call your local emergency services (911 in the [place] Do not drive yourself to the hospital. Summary  Shortness of breath is when you have trouble breathing enough air. It can be a sign of a medical problem.  Avoid things that make it hard for you to breathe, such as smoking, pollution, mold, and dust.  Watch for any changes in your symptoms. Contact your doctor if you do not get better or you get worse. This  information is not intended to replace advice given to you by your health care provider. Make sure you discuss any questions you have with your health care provider. Document Released: 07/17/2007 Document Revised: 06/30/2017 Document Reviewed: 06/30/2017 Elsevier Interactive Patient Education  2019 Elsevier Inc. Fluticasone nasal spray What is this medicine? FLUTICASONE (floo TIK a sone) is a corticosteroid. This medicine is used to treat the symptoms of allergies like sneezing, itchy red eyes, and itchy, runny, or stuffy nose. This medicine is also used to treat nasal polyps. This medicine may be used for other purposes; ask your health care provider or pharmacist if you have questions. COMMON BRAND NAME(S): ClariSpray, Flonase, Flonase Allergy Relief, Flonase Sensimist, Veramyst, XHANCE What should I tell my health care provider before I take this medicine? They need to know if you have any of these conditions: -eye disease, vision problems -infection, like tuberculosis, herpes, or fungal infection -recent surgery on nose or sinuses -taking a corticosteroid by mouth -an unusual or allergic reaction to fluticasone, steroids, other medicines, foods, dyes, or preservatives -pregnant or trying to get pregnant -breast-feeding How should I use this medicine? This medicine is for use in the nose. Follow the directions on your product or prescription label. This medicine works best if used at regular intervals. Do not use more often than directed. Make sure that you are using your nasal spray correctly. After 6 months of daily use for allergies, talk to your doctor or health care professional before using it for a longer time. Ask your doctor or health care professional if you have any questions. Talk to your pediatrician regarding the use of this medicine in children. Special care may be needed. Some products have been used for allergies in children as young as 2 years. After 2 months of daily use  without a prescription in a child, talk to your pediatrician before using it for a longer time. Use of this medicine for nasal polyps is not approved in children. Overdosage: If you think you have taken too much of this medicine contact a poison control center or emergency room at once. NOTE: This medicine is only for you. Do not share this medicine with others. What if I miss a dose? If you miss a dose, use it as soon as you remember. If it is almost time for your next dose, use only that dose and continue with your regular schedule. Do not use double or extra doses. What may interact with this medicine? -certain antibiotics like clarithromycin and telithromycin -certain medicines for fungal infections like ketoconazole, itraconazole, and voriconazole -conivaptan -nefazodone -some medicines for HIV -vaccines This list may not describe all possible interactions. Give your health care provider a list of all the medicines, herbs, non-prescription drugs, or dietary supplements you use. Also tell them if you smoke, drink alcohol, or use illegal drugs. Some items may interact with your medicine. What should I watch for while using this medicine?  Visit your healthcare professional for regular checks on your progress. Tell your healthcare professional if your symptoms do not start to get better or if they get worse. This medicine may increase your risk of getting an infection. Tell your doctor or health care professional if you are around anyone with measles or chickenpox, or if you develop sores or blisters that do not heal properly. What side effects may I notice from receiving this medicine? Side effects that you should report to your doctor or health care professional as soon as possible: -allergic reactions like skin rash, itching or hives, swelling of the face, lips, or tongue -changes in vision -crusting or sores in the nose -nosebleed -signs and symptoms of infection like fever or chills;  cough; sore throat -white patches or sores in the mouth or nose Side effects that usually do not require medical attention (report to your doctor or health care professional if they continue or are bothersome): -burning or irritation inside the nose or throat -changes in taste or smell -cough -headache This list may not describe all possible side effects. Call your doctor for medical advice about side effects. You may report side effects to FDA at 1-800-FDA-1088. Where should I keep my medicine? Keep out of the reach of children. Store at room temperature between 15 and 30 degrees C (59 and 86 degrees F). Avoid exposure to extreme heat, cold, or light. Throw away any unused medicine after the expiration date. NOTE: This sheet is a summary. It may not cover all possible information. If you have questions about this medicine, talk to your doctor, pharmacist, or health care provider.  2019 Elsevier/Gold Standard (2017-02-20 14:10:08)  Allergies, Adult An allergy means that your body reacts to something that bothers it (allergen). It is not a normal reaction. This can happen from something that you:  Eat.  Breathe in.  Touch. You can have an allergy (be allergic) to:  Outdoor things, like: ? Pollen. ? Grass. ? Weeds.  Indoor things, like: ? Dust. ? Smoke. ? Pet dander.  Foods.  Medicines.  Things that bother your skin, like: ? Detergents. ? Chemicals. ? Latex.  Perfume.  Bugs. An allergy cannot spread from person to person (is not contagious). Follow these instructions at home:         Stay away from things that you know you are allergic to.  If you have allergies to things in the air, wash out your nose each day. Do it with one of these: ? A salt-water (saline) spray. ? A container (neti pot).  Take over-the-counter and prescription medicines only as told by your doctor.  Keep all follow-up visits as told by your doctor. This is important.  If you are at  risk for a very bad allergy reaction (anaphylaxis), keep an auto-injector with you all the time. This is called an epinephrine injection. ? This is pre-measured medicine with a needle. You can put it into your skin by yourself. ? Right after you have a very bad allergy reaction, you or a person with you must give the medicine in less than a few minutes. This is an emergency.  If you have ever had a very bad allergy reaction, wear a medical alert bracelet or necklace. Your very bad allergy should be written on it. Contact a health care provider if:  Your symptoms do not get better with treatment. Get help right away if:  You have symptoms of a very bad allergy reaction. These include: ? A swollen  mouth, tongue, or throat. ? Pain or tightness in your chest. ? Trouble breathing. ? Being short of breath. ? Dizziness. ? Fainting. ? Very bad pain in your belly (abdomen). ? Throwing up (vomiting). ? Watery poop (diarrhea). Summary  An allergy means that your body reacts to something that bothers it (allergen). It is not a normal reaction.  Stay away from things that make your body react.  Take over-the-counter and prescription medicines only as told by your doctor.  If you are at risk for a very bad allergy reaction, carry an auto-injector (epinephrine injection) all the time. Also, wear a medical alert bracelet or necklace so people know about your allergy. This information is not intended to replace advice given to you by your health care provider. Make sure you discuss any questions you have with your health care provider. Document Released: 05/25/2012 Document Revised: 05/13/2016 Document Reviewed: 05/13/2016 Elsevier Interactive Patient Education  2019 [employer].

## 2018-04-04 NOTE — Progress Notes (Signed)
Established Patient Office Visit  Subjective:  Patient ID: Alvin Walker, male    DOB: 1976/01/24  Age: 43 y.o. MRN: [id number]  CC:  Chief Complaint  Patient presents with  . Allergies    started dec   . Sinusitis    HPI Alvin Walker is a 43 year old male who presents for Sick Visit.   Past Medical History:  Diagnosis Date  . Allergy    Current Status: Since his last office visit, he has cough and shortness of breath. He denies chest pain, heart palpitations, cough and shortness of breath reported. He has a history of allergies states that his symptoms have began in December, which  earlier this season.   He denies fevers, chills, fatigue, recent infections, weight loss, and night sweats. He has not had any headaches, visual changes, dizziness, and falls. No reports of GI problems such as nausea, vomiting, diarrhea, and constipation. He has no reports of blood in stools, dysuria and hematuria. No depression or anxiety reported. He denies pain today.   No past surgical history on file.  Family History  Problem Relation Age of Onset  . Hypertension Father     Social History   Socioeconomic History  . Marital status: Single    Spouse name: Not on file  . Number of children: Not on file  . Years of education: Not on file  . Highest education level: Not on file  Occupational History  . Not on file  Social Needs  . Financial resource strain: Not on file  . Food insecurity:    Worry: Not on file    Inability: Not on file  . Transportation needs:    Medical: Not on file    Non-medical: Not on file  Tobacco Use  . Smoking status: Never Smoker  . Smokeless tobacco: Never Used  Substance and Sexual Activity  . Alcohol use: Yes  . Drug use: No  . Sexual activity: Not on file  Lifestyle  . Physical activity:    Days per week: Not on file    Minutes per session: Not on file  . Stress: Not on file  Relationships  . Social connections:    Talks on phone: Not  on file    Gets together: Not on file    Attends religious service: Not on file    Active member of club or organization: Not on file    Attends meetings of clubs or organizations: Not on file    Relationship status: Not on file  . Intimate partner violence:    Fear of current or ex partner: Not on file    Emotionally abused: Not on file    Physically abused: Not on file    Forced sexual activity: Not on file  Other Topics Concern  . Not on file  Social History Narrative  . Not on file    Outpatient Medications Prior to Visit  Medication Sig Dispense Refill  . cetirizine (ZYRTEC) 10 MG tablet Take 1 tablet (10 mg total) by mouth daily. 90 tablet 3  . albuterol (PROVENTIL HFA;VENTOLIN HFA) 108 (90 BASE) MCG/ACT inhaler Inhale 1-2 puffs into the lungs every 4 (four) hours as needed for wheezing or shortness of breath. (Patient not taking: Reported on 10/03/2014) 1 Inhaler 0  . amoxicillin (AMOXIL) 875 MG tablet Take 1 tablet (875 mg total) by mouth 2 (two) times daily. 20 tablet 0  . montelukast (SINGULAIR) 10 MG tablet Take 1 tablet (10 mg total) by mouth  at bedtime. 30 tablet 6  . pseudoephedrine (SUDAFED 12 HOUR) 120 MG 12 hr tablet Take 1 tablet (120 mg total) by mouth 2 (two) times daily. 30 tablet 3   No facility-administered medications prior to visit.     No Known Allergies  ROS Review of Systems  Constitutional: Negative.   HENT: Negative.   Eyes: Negative.   Respiratory: Positive for shortness of breath (Occasional).   Cardiovascular: Negative.   Gastrointestinal: Negative.   Endocrine: Negative.   Genitourinary: Negative.   Musculoskeletal: Negative.   Skin: Negative.   Allergic/Immunologic:       Seasonal Allergies  Neurological: Negative.   Hematological: Negative.   Psychiatric/Behavioral: Negative.    Objective:    Physical Exam  Constitutional: He is oriented to person, place, and time. He appears well-developed and well-nourished.  HENT:  Head:  Normocephalic and atraumatic.  Eyes: Conjunctivae are normal.  Neck: Normal range of motion. Neck supple.  Cardiovascular: Normal rate, regular rhythm, normal heart sounds and intact distal pulses.  Pulmonary/Chest: Effort normal and breath sounds normal.  Abdominal: Soft. Bowel sounds are normal.  Musculoskeletal: Normal range of motion.  Neurological: He is alert and oriented to person, place, and time. He has normal reflexes.  Skin: Skin is warm and dry.  Psychiatric: He has a normal mood and affect. His behavior is normal. Judgment and thought content normal.  Nursing note and vitals reviewed.   BP 120/78 (BP Location: Right Arm, Patient Position: Sitting, Cuff Size: Normal)   Pulse 74   Temp 98.9 F (37.2 C) (Oral)   Ht '5\' 7"'  (1.702 m)   Wt 155 lb 6.4 oz (70.5 kg)   SpO2 100%   BMI 24.34 kg/m  Wt Readings from Last 3 Encounters:  04/04/18 155 lb 6.4 oz (70.5 kg)  04/10/17 158 lb 6.4 oz (71.8 kg)  04/14/15 148 lb (67.1 kg)     Health Maintenance Due  Topic Date Due  . HIV Screening  11/11/1991  . TETANUS/TDAP  11/11/1995  . INFLUENZA VACCINE  09/11/2017    There are no preventive care reminders to display for this patient.  No results found for: TSH No results found for: WBC, HGB, HCT, MCV, PLT No results found for: NA, K, CHLORIDE, CO2, GLUCOSE, BUN, CREATININE, BILITOT, ALKPHOS, AST, ALT, PROT, ALBUMIN, CALCIUM, ANIONGAP, EGFR, GFR No results found for: CHOL No results found for: HDL No results found for: LDLCALC No results found for: TRIG No results found for: CHOLHDL No results found for: HGBA1C  Assessment & Plan:   1. Seasonal allergic rhinitis due to other allergic trigger Depo-Medrol administer in office today. Patient tolerated well with no complaints. We will initiate Flonase today. Continue medications r/t allergies. No signs or symptoms of respiratory distress noted or reported today. - methylPREDNISolone acetate (DEPO-MEDROL) injection 80 mg -  cetirizine (ZYRTEC) 10 MG tablet; Take 1 tablet (10 mg total) by mouth daily.  Dispense: 90 tablet; Refill: 3 - montelukast (SINGULAIR) 10 MG tablet; Take 1 tablet (10 mg total) by mouth at bedtime.  Dispense: 30 tablet; Refill: 6 - fluticasone (FLONASE) 50 MCG/ACT nasal spray; Place 2 sprays into both nostrils daily.  Dispense: 16 g; Refill: 6  2. Cough - albuterol (PROVENTIL HFA;VENTOLIN HFA) 108 (90 Base) MCG/ACT inhaler; Inhale 1-2 puffs into the lungs every 4 (four) hours as needed for wheezing or shortness of breath.  Dispense: 1 Inhaler; Refill: 3  3. Follow up He will follow up as needed.   Meds ordered this  encounter  Medications  . methylPREDNISolone acetate (DEPO-MEDROL) injection 80 mg  . cetirizine (ZYRTEC) 10 MG tablet    Sig: Take 1 tablet (10 mg total) by mouth daily.    Dispense:  90 tablet    Refill:  3  . albuterol (PROVENTIL HFA;VENTOLIN HFA) 108 (90 Base) MCG/ACT inhaler    Sig: Inhale 1-2 puffs into the lungs every 4 (four) hours as needed for wheezing or shortness of breath.    Dispense:  1 Inhaler    Refill:  3  . montelukast (SINGULAIR) 10 MG tablet    Sig: Take 1 tablet (10 mg total) by mouth at bedtime.    Dispense:  30 tablet    Refill:  6  . fluticasone (FLONASE) 50 MCG/ACT nasal spray    Sig: Place 2 sprays into both nostrils daily.    Dispense:  16 g    Refill:  6    No orders of the defined types were placed in this encounter.   Referral Orders  No referral(s) requested today    Kathe Becton,  MSN, FNP-C [facility] [place], [place] [address] [phone]   Problem List Items Addressed This Visit    None      No orders of the defined types were placed in this encounter.   Follow-up: No follow-ups on file.    Azzie Glatter, FNP

## 2018-04-06 ENCOUNTER — Telehealth: Payer: Self-pay | Admitting: Family Medicine

## 2018-04-06 NOTE — Telephone Encounter (Signed)
Copied from CRM [id number]. Topic: Quick Communication - See Telephone Encounter >> Apr 06, 2018  9:46 AM Arlyss Gandy, NT wrote: CRM for notification. See Telephone encounter for: 04/06/18. Pt states the antibiotic was not sent in for him on Saturday and he would like to see if the physician was going to send this in for him?

## 2018-04-09 NOTE — Telephone Encounter (Signed)
I have called the pt and informed him that we have given him the steroid shot in the office and Flonase. I did not see anything about an antibiotic. I have informed pt of this. Pt was under the impression that he was going to get the same medication as last year when he had this problem. I stated that I will note it and ask the provider about the antibiotic. Pt stated understanding.    Thanks [employer]

## 2018-08-31 ENCOUNTER — Telehealth (INDEPENDENT_AMBULATORY_CARE_PROVIDER_SITE_OTHER): Payer: Managed Care, Other (non HMO) | Admitting: Family Medicine

## 2018-08-31 ENCOUNTER — Ambulatory Visit: Payer: Self-pay | Admitting: *Deleted

## 2018-08-31 ENCOUNTER — Encounter: Payer: Self-pay | Admitting: Family Medicine

## 2018-08-31 VITALS — Ht 67.0 in | Wt 152.0 lb

## 2018-08-31 DIAGNOSIS — K219 Gastro-esophageal reflux disease without esophagitis: Secondary | ICD-10-CM

## 2018-08-31 DIAGNOSIS — R1012 Left upper quadrant pain: Secondary | ICD-10-CM

## 2018-08-31 MED ORDER — OMEPRAZOLE 20 MG PO CPDR: 20.0000 mg | DELAYED_RELEASE_CAPSULE | Freq: Every day | ORAL | 6 refills | Status: AC

## 2018-08-31 MED ORDER — SUCRALFATE 1 G PO TABS: 1.0000 g | ORAL_TABLET | Freq: Three times a day (TID) | ORAL | 1 refills | Status: AC

## 2018-08-31 NOTE — Patient Instructions (Signed)
° ° ° °  If you have lab work done today you will be contacted with your lab results within the next 2 weeks.  If you have not heard from us then please contact us. The fastest way to get your results is to register for My Chart. ° ° °IF you received an x-ray today, you will receive an invoice from [facility]. Please contact [facility] at [phone] with questions or concerns regarding your invoice.  ° °IF you received labwork today, you will receive an invoice from [facility]. Please contact [facility] at [phone] with questions or concerns regarding your invoice.  ° °Our billing staff will not be able to assist you with questions regarding bills from these companies. ° °You will be contacted with the lab results as soon as they are available. The fastest way to get your results is to activate your My Chart account. Instructions are located on the last page of this paperwork. If you have not heard from us regarding the results in 2 weeks, please contact this office. °  ° ° ° °

## 2018-08-31 NOTE — Progress Notes (Signed)
Telemedicine Encounter- SOAP NOTE Established Patient  This telephone encounter was conducted with the patient's (or proxy's) verbal consent via audio telecommunications: yes/no: Yes Patient was instructed to have this encounter in a suitably private space; and to only have persons present to whom they give permission to participate. In addition, patient identity was confirmed by use of name plus two identifiers (DOB and address).  I discussed the limitations, risks, security and privacy concerns of performing an evaluation and management service by telephone and the availability of in person appointments. I also discussed with the patient that there may be a patient responsible charge related to this service. The patient expressed understanding and agreed to proceed.  I spent a total of TIME; 0 MIN TO 60 MIN: 25 minutes talking with the patient or their proxy.  CC: stomach pain  Subjective   Alvin Walker is a 43 y.o. established patient. Telephone visit today for  HPI   LUQ pain for 4 days States that hte pain is in the upper left side of his stomach  He has a history of stomach concerns in the past but nothing for this long No epigastric pain The pain is like burning  The pain is not stabbing or twisting If he drinks milk it calms it down but only for a little bit but starts again after 30 minutes He does not feel gassy He tries to force a BM to see if that will help  He does not eat spicy food Spicy food causes his stomach to hurt for 3 days He is avoiding acid  He gained some weight recently Wt Readings from Last 3 Encounters:  08/31/18 152 lb (68.9 kg)  04/04/18 155 lb 6.4 oz (70.5 kg)  04/10/17 158 lb 6.4 oz (71.8 kg)    He does not drink alcohol He does not smoke  He is drinking pepto-bismol which helps him and calms it down   There are no active problems to display for this patient.   Past Medical History:  Diagnosis Date  . Allergy     Current  Outpatient Medications  Medication Sig Dispense Refill  . cetirizine (ZYRTEC) 10 MG tablet Take 1 tablet (10 mg total) by mouth daily. 90 tablet 3  . albuterol (PROVENTIL HFA;VENTOLIN HFA) 108 (90 Base) MCG/ACT inhaler Inhale 1-2 puffs into the lungs every 4 (four) hours as needed for wheezing or shortness of breath. (Patient not taking: Reported on 08/31/2018) 1 Inhaler 3  . fluticasone (FLONASE) 50 MCG/ACT nasal spray Place 2 sprays into both nostrils daily. (Patient not taking: Reported on 08/31/2018) 16 g 6  . montelukast (SINGULAIR) 10 MG tablet Take 1 tablet (10 mg total) by mouth at bedtime. (Patient not taking: Reported on 08/31/2018) 30 tablet 6   No current facility-administered medications for this visit.     No Known Allergies  Social History   Socioeconomic History  . Marital status: Single    Spouse name: Not on file  . Number of children: Not on file  . Years of education: Not on file  . Highest education level: Not on file  Occupational History  . Not on file  Social Needs  . Financial resource strain: Not on file  . Food insecurity    Worry: Not on file    Inability: Not on file  . Transportation needs    Medical: Not on file    Non-medical: Not on file  Tobacco Use  . Smoking status: Never Smoker  . Smokeless  tobacco: Never Used  Substance and Sexual Activity  . Alcohol use: Yes  . Drug use: No  . Sexual activity: Not on file  Lifestyle  . Physical activity    Days per week: Not on file    Minutes per session: Not on file  . Stress: Not on file  Relationships  . Social Herbalist on phone: Not on file    Gets together: Not on file    Attends religious service: Not on file    Active member of club or organization: Not on file    Attends meetings of clubs or organizations: Not on file    Relationship status: Not on file  . Intimate partner violence    Fear of current or ex partner: Not on file    Emotionally abused: Not on file    Physically  abused: Not on file    Forced sexual activity: Not on file  Other Topics Concern  . Not on file  Social History Narrative  . Not on file    ROS Review of Systems  Constitutional: Negative for activity change, appetite change, chills and fever.  HENT: Negative for congestion, nosebleeds, trouble swallowing and voice change.   Respiratory: Negative for cough, shortness of breath and wheezing.   Gastrointestinal: Negative for diarrhea, nausea and vomiting.  Genitourinary: Negative for difficulty urinating, dysuria, flank pain and hematuria.  Musculoskeletal: Negative for back pain, joint swelling and neck pain.  Neurological: Negative for dizziness, speech difficulty, light-headedness and numbness.  See HPI. All other review of systems negative.   Objective   Vitals as reported by the patient: Today's Vitals   08/31/18 1352  Weight: 152 lb (68.9 kg)  Height: 5\' 7"  (1.702 m)   Normal effort Alert and oriented  Unable to do exam due to phone visit Instructed patient on where and how to palpate the abdomen Pt palpated his abdomen in the LUQ and reports tenderness that was mild Mild bloating at the umbilicus Nontender in the RUQ or lower abdomen   Diagnoses and all orders for this visit:  Gastroesophageal reflux disease, esophagitis presence not specified  LUQ abdominal pain     I discussed the assessment and treatment plan with the patient. The patient was provided an opportunity to ask questions and all were answered. The patient agreed with the plan and demonstrated an understanding of the instructions.   The patient was advised to call back or seek an in-person evaluation if the symptoms worsen or if the condition fails to improve as anticipated.  I provided 25 minutes of non-face-to-face time during this encounter.  Forrest Moron, MD  Primary Care at [facility]

## 2018-08-31 NOTE — Telephone Encounter (Signed)
Pt called in c/o left upper abd pain for 5 days.  It's a stabbing pain.   He has stomach problems anyway but this feels different.   He usually has pain in his left lower abd but this time it's in his upper left abd and is not going away.  See triage notes.  I warm transferred his call to [facility] at [place] at [facility] to be scheduled a virtual visit due to the COVID-19 pandemic.    Reason for Disposition . [1] MILD-MODERATE pain AND [2] constant AND [3] present > 2 hours  Answer Assessment - Initial Assessment Questions 1. LOCATION: "Where does it hurt?"      I'm having abdominal days for 5 days.   I have stomach problems anyway.  But this feels different.   It's stabbing pain in the upper left side of my stomach.    2. RADIATION: "Does the pain shoot anywhere else?" (e.g., chest, back)     Usually on the left lower side of my abd but this is different.   3. ONSET: "When did the pain begin?" (Minutes, hours or days ago)      5 days ago. 4. SUDDEN: "Gradual or sudden onset?"     Sudden.     5. PATTERN "Does the pain come and go, or is it constant?"    - If constant: "Is it getting better, staying the same, or worsening?"      (Note: Constant means the pain never goes away completely; most serious pain is constant and it progresses)     - If intermittent: "How long does it last?" "Do you have pain now?"     (Note: Intermittent means the pain goes away completely between bouts)     It has been constant. 6. SEVERITY: "How bad is the pain?"  (e.g., Scale 1-10; mild, moderate, or severe)    - MILD (1-3): doesn't interfere with normal activities, abdomen soft and not tender to touch     - MODERATE (4-7): interferes with normal activities or awakens from sleep, tender to touch     - SEVERE (8-10): excruciating pain, doubled over, unable to do any normal activities       5 now. 7. RECURRENT SYMPTOM: "Have you ever had this type of abdominal pain before?" If so, ask: "When was the last  time?" and "What happened that time?"      Not on the upper side but in the lower side. 8. CAUSE: "What do you think is causing the abdominal pain?"     I don't know.     9. RELIEVING/AGGRAVATING FACTORS: "What makes it better or worse?" (e.g., movement, antacids, bowel movement)     No changes 10. OTHER SYMPTOMS: "Has there been any vomiting, diarrhea, constipation, or urine problems?"       6 months ago I noticed blood in my stool that happened one time.  Protocols used: ABDOMINAL PAIN - MALE-A-AH

## 2018-08-31 NOTE — Progress Notes (Signed)
CC: abdominal pain x 4 days.  1st day pain was constant all day, stabbing pain but for the next 3 days of the 4 days pain was stabbing and intermittent.  Pain level 5/10, not taking any otc pain meds.  Per pt he started pepto bismol on yesterday.  No c/o nausea or emesis.  No fevers noticced.  Per pt his weight is 152 lb, recent bp taken. No travel outside the [place] or [employer] in the past 3 weeks.  No refills needed.
